# Patient Record
Sex: Female | Born: 1965 | Race: Black or African American | Hispanic: No | Marital: Married | Smoking: Never smoker
Health system: Southern US, Community
[De-identification: ages and names within clinical notes are randomized; demographics above are authoritative.]

## PROBLEM LIST (undated history)

## (undated) DIAGNOSIS — B019 Varicella without complication: Secondary | ICD-10-CM

## (undated) DIAGNOSIS — N6019 Diffuse cystic mastopathy of unspecified breast: Secondary | ICD-10-CM

## (undated) DIAGNOSIS — I10 Essential (primary) hypertension: Secondary | ICD-10-CM

## (undated) DIAGNOSIS — E785 Hyperlipidemia, unspecified: Secondary | ICD-10-CM

## (undated) DIAGNOSIS — G43909 Migraine, unspecified, not intractable, without status migrainosus: Secondary | ICD-10-CM

## (undated) HISTORY — PX: ECTOPIC PREGNANCY SURGERY: SHX613

## (undated) HISTORY — DX: Diffuse cystic mastopathy of unspecified breast: N60.19

## (undated) HISTORY — DX: Hyperlipidemia, unspecified: E78.5

## (undated) HISTORY — PX: ABDOMINAL HYSTERECTOMY: SHX81

## (undated) HISTORY — DX: Migraine, unspecified, not intractable, without status migrainosus: G43.909

## (undated) HISTORY — DX: Varicella without complication: B01.9

## (undated) HISTORY — DX: Essential (primary) hypertension: I10

---

## 1997-09-15 DIAGNOSIS — I10 Essential (primary) hypertension: Secondary | ICD-10-CM

## 1997-09-15 HISTORY — DX: Essential (primary) hypertension: I10

## 2005-05-06 ENCOUNTER — Ambulatory Visit: Payer: Self-pay

## 2006-05-12 ENCOUNTER — Ambulatory Visit: Payer: Self-pay

## 2006-07-14 ENCOUNTER — Ambulatory Visit: Payer: Self-pay | Admitting: Unknown Physician Specialty

## 2007-07-08 ENCOUNTER — Ambulatory Visit: Payer: Self-pay

## 2008-07-21 ENCOUNTER — Ambulatory Visit: Payer: Self-pay | Admitting: Family Medicine

## 2008-08-22 ENCOUNTER — Ambulatory Visit: Payer: Self-pay | Admitting: Family Medicine

## 2010-07-16 IMAGING — US US PELV - US TRANSVAGINAL
1 series · 17 of 25 positions shown · non-contrast
Comparison: none

REASON FOR EXAM: dysfunctional uterine bleeding
COMMENTS:

[Series 1: us pelv - us transvaginal · 17 of 54 slices shown]
[im 1/54]
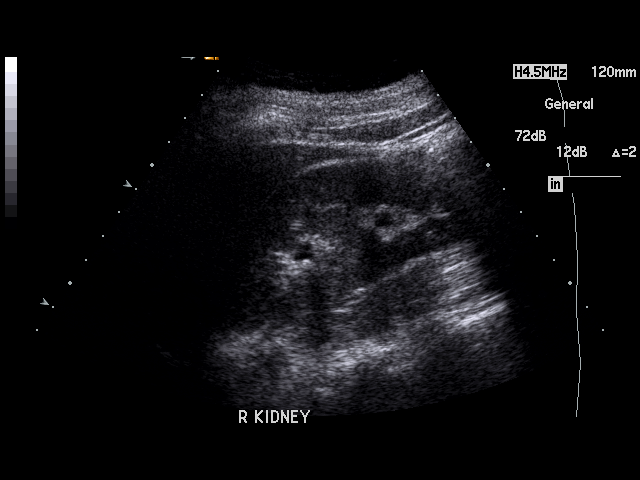
[im 5/54]
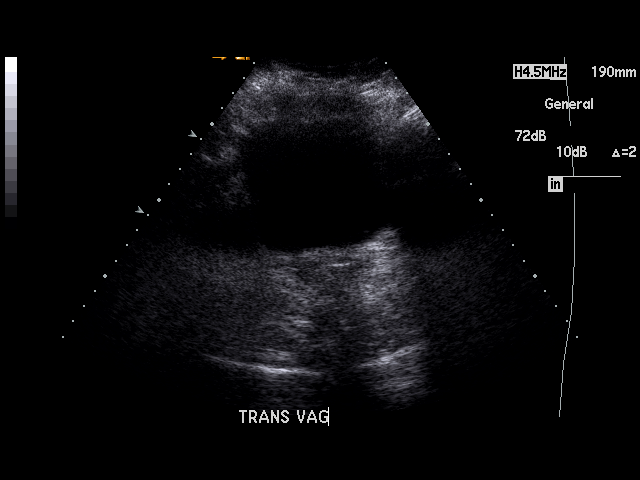
[im 7/54]
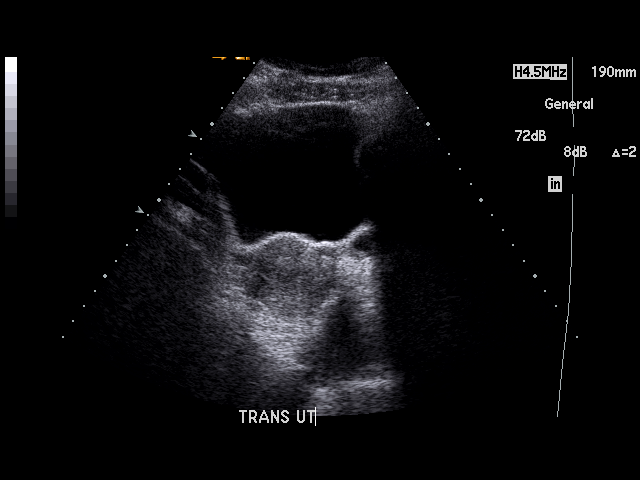
[im 12/54]
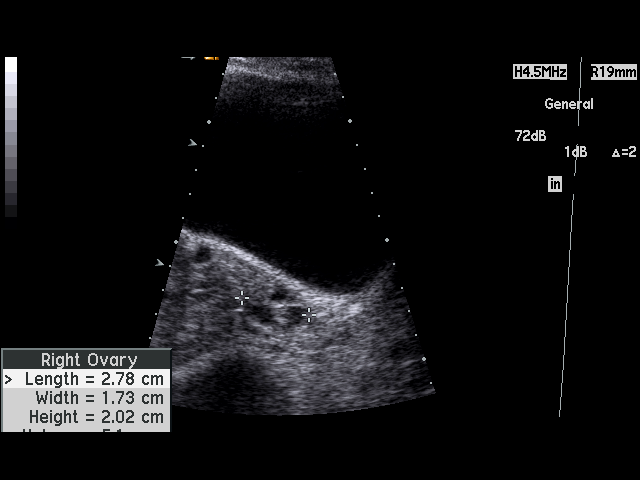
[im 14/54]
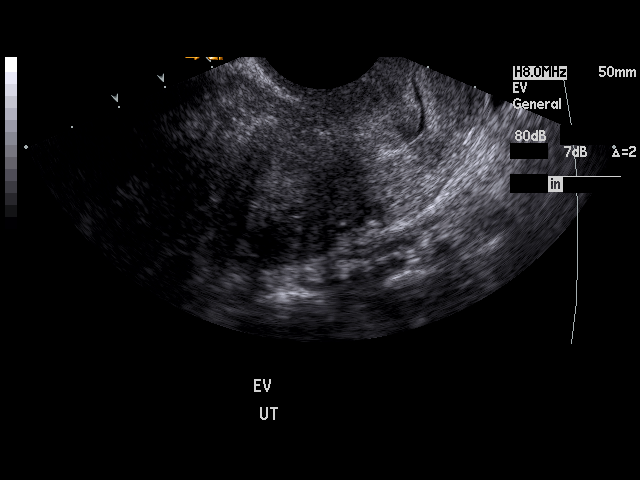
[im 18/54]
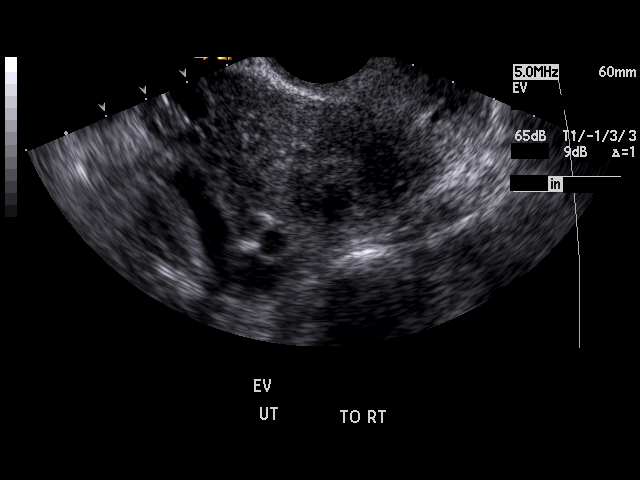
[im 20/54]
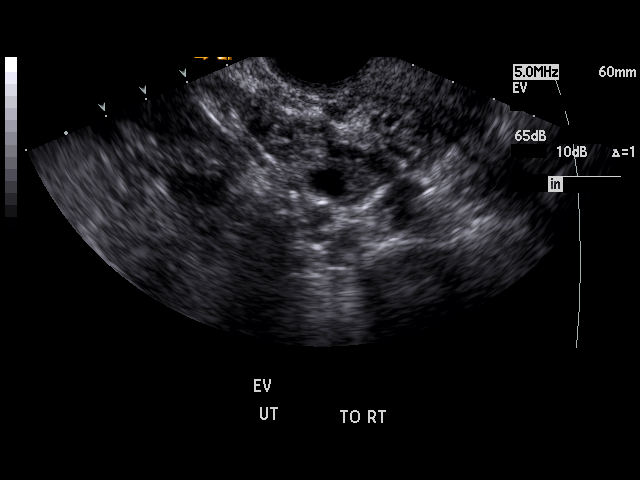
[im 25/54]
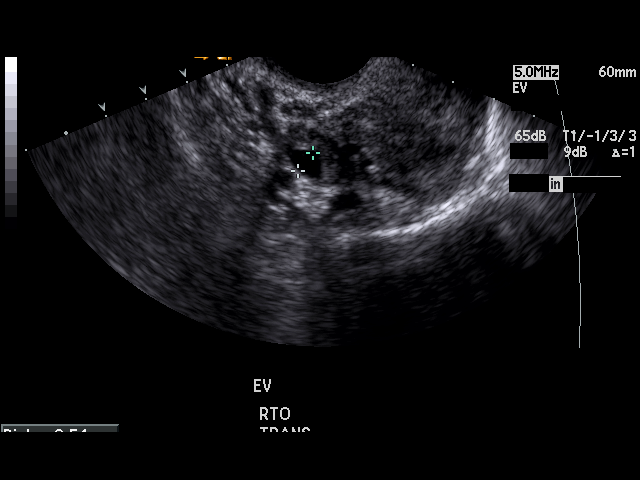
[im 27/54]
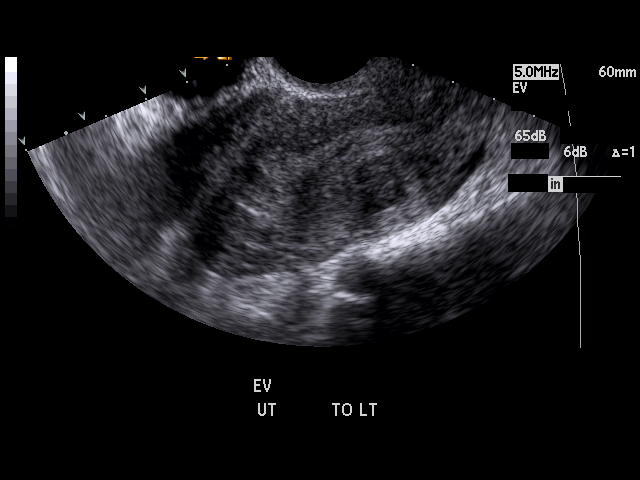
[im 29/54]
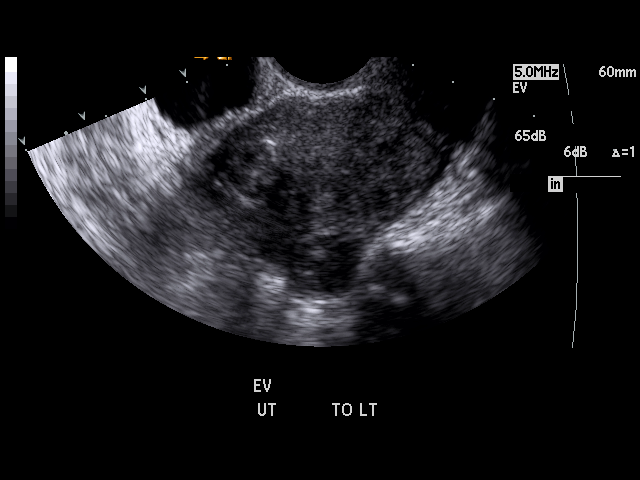
[im 34/54]
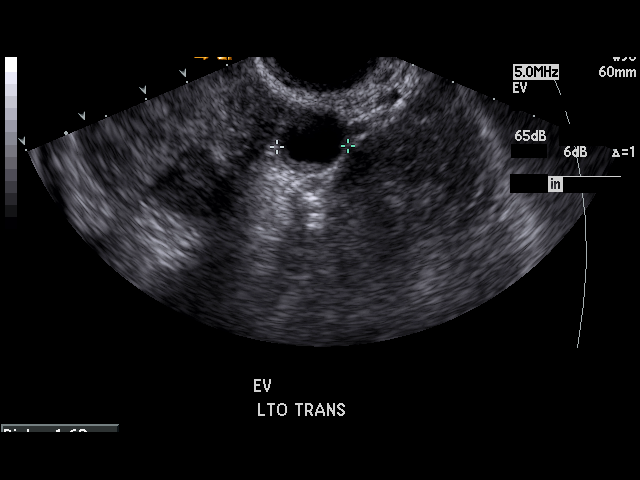
[im 36/54]
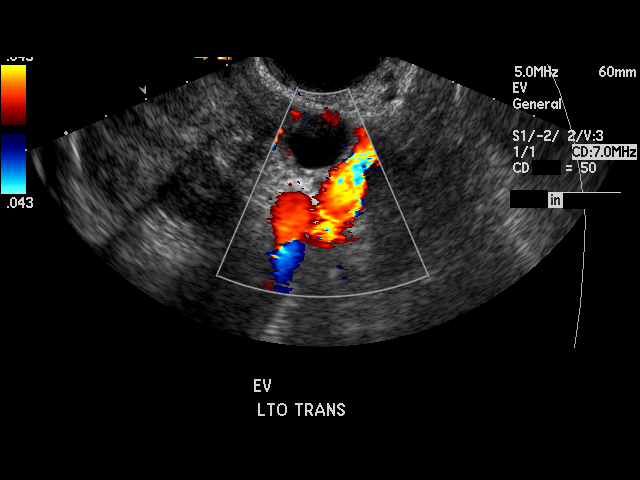
[im 40/54]
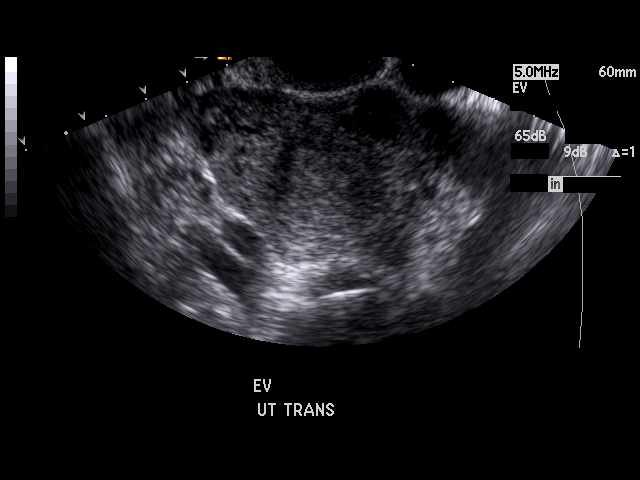
[im 42/54]
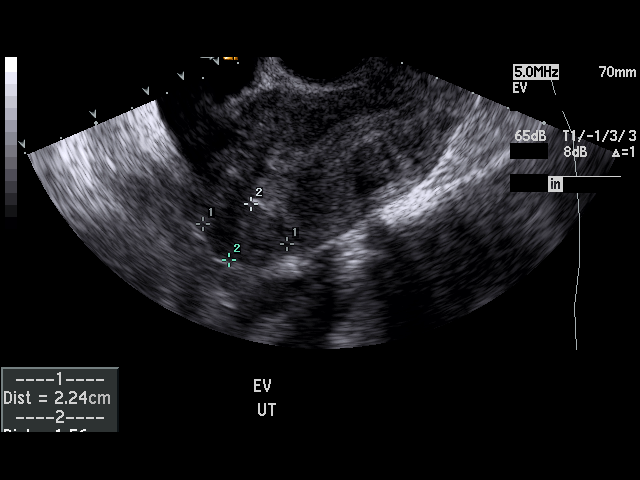
[im 47/54]
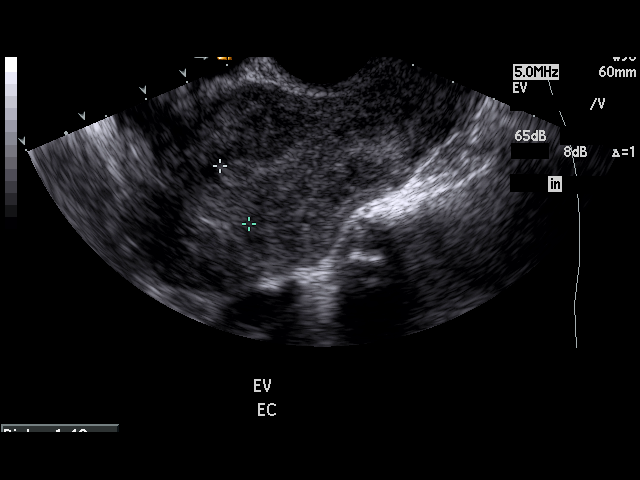
[im 49/54]
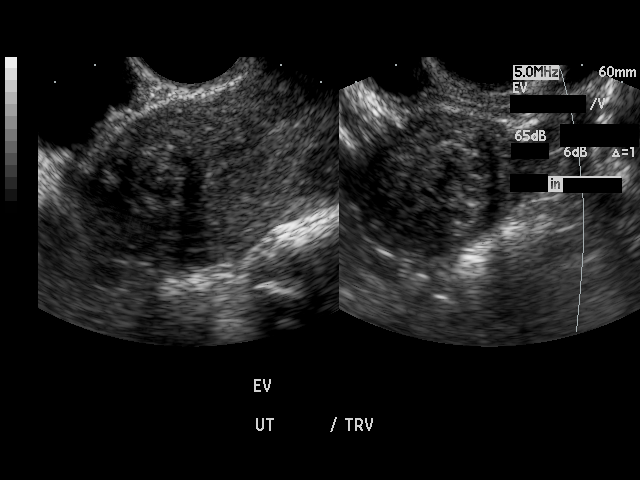
[im 54/54]
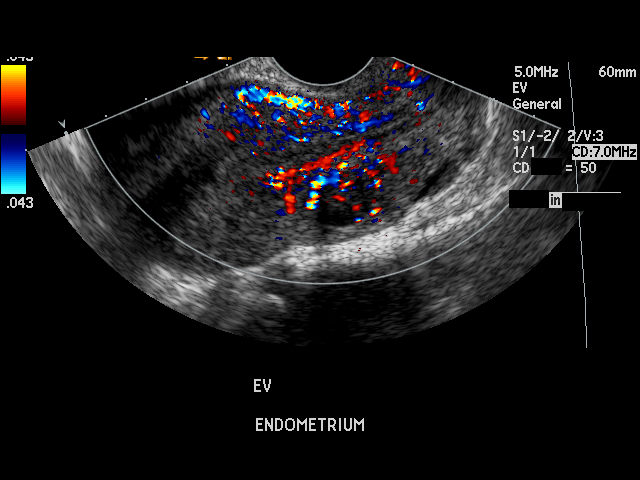

[17 of 25 positions shown; findings below may reference images not displayed]

PROCEDURE:     US  - US PELVIS MASS EXAM W/TRANSVAGI  - July 21, 2008  [DATE]

RESULT:     The uterus is mildly prominent in size measuring 10.2 x 4.8 x
4.9 cm. The endometrial stripe measures 7 mm. There are multiple fibroids
demonstrated in the uterus. The largest of these lies posteriorly and
measures 2.2 x 1.6 x 1.5 cm. The RIGHT ovary measures 2.4 x 1.2 x 1.0 cm and
contains an approximately 0.5 x 0.8 x 0.5 cm diameter cyst. The LEFT ovary
measures 3.8 x 1.7 x 1.6 cm and contains an approximately 1.8 x 1.3 x 1.4 cm
simple appearing cyst. Survey views of the kidneys are within the limits of
normal. There is a small amount of fluid in the cul-de-sac.
IMPRESSION: 1. There are multiple uterine fibroids present with the largest measuring
approximately 2.2 cm. The endometrial stripe is not abnormally thickened.
2. There are cysts associated with both ovaries which appear to be simple
cystic in nature.

## 2010-08-27 ENCOUNTER — Ambulatory Visit: Payer: Self-pay | Admitting: Family Medicine

## 2010-09-15 HISTORY — PX: PARTIAL HYSTERECTOMY: SHX80

## 2011-01-01 ENCOUNTER — Ambulatory Visit: Payer: Self-pay

## 2011-01-07 ENCOUNTER — Ambulatory Visit: Payer: Self-pay

## 2011-01-08 LAB — PATHOLOGY REPORT

## 2011-01-18 ENCOUNTER — Emergency Department: Payer: Self-pay | Admitting: Emergency Medicine

## 2011-09-16 DIAGNOSIS — N6019 Diffuse cystic mastopathy of unspecified breast: Secondary | ICD-10-CM

## 2011-09-16 HISTORY — DX: Diffuse cystic mastopathy of unspecified breast: N60.19

## 2011-09-16 HISTORY — PX: BREAST BIOPSY: SHX20

## 2012-01-28 ENCOUNTER — Ambulatory Visit: Payer: Self-pay | Admitting: Family Medicine

## 2012-04-26 ENCOUNTER — Ambulatory Visit: Payer: Self-pay

## 2012-11-24 ENCOUNTER — Encounter: Payer: Self-pay | Admitting: *Deleted

## 2012-12-15 ENCOUNTER — Encounter: Payer: Self-pay | Admitting: General Surgery

## 2013-01-14 ENCOUNTER — Encounter: Payer: Self-pay | Admitting: Internal Medicine

## 2013-01-14 ENCOUNTER — Ambulatory Visit (INDEPENDENT_AMBULATORY_CARE_PROVIDER_SITE_OTHER): Payer: BC Managed Care – PPO | Admitting: Internal Medicine

## 2013-01-14 VITALS — BP 130/82 | HR 96 | Temp 98.2°F | Resp 16 | Ht 62.0 in | Wt 168.2 lb

## 2013-01-14 DIAGNOSIS — R7989 Other specified abnormal findings of blood chemistry: Secondary | ICD-10-CM

## 2013-01-14 DIAGNOSIS — R635 Abnormal weight gain: Secondary | ICD-10-CM

## 2013-01-14 DIAGNOSIS — I10 Essential (primary) hypertension: Secondary | ICD-10-CM

## 2013-01-14 DIAGNOSIS — E785 Hyperlipidemia, unspecified: Secondary | ICD-10-CM | POA: Insufficient documentation

## 2013-01-14 DIAGNOSIS — R946 Abnormal results of thyroid function studies: Secondary | ICD-10-CM

## 2013-01-14 NOTE — Progress Notes (Signed)
Patient ID: Bethany Fernandez, female   DOB: 09-29-1965, 47 y.o.   MRN: [id number]   Patient Active Problem List   Diagnosis Date Noted  . Weight gain, abnormal 01/16/2013  . Abnormal thyroid blood test 01/16/2013  . Essential hypertension, benign 01/14/2013    Subjective:  CC:   Chief Complaint  Patient presents with  . Establish Care    HPI:   Bethany Fernandez is a 47 y.o. female who presents as a new patient to establish primary care with the chief complaint of  Weight gain.  She is a 47 yr old AA female with a history of treated hypertension who was referred by patient Jaquita Rector for primary care. Former PCP Artis Flock,  Who moved to [place]. . .  Weighed 104 lbs when she joined the [employer] after high school and weighed 134 lbs upon returning to civilian life and has gained 34 lbs since then.  She was exercisinsg regularly until Dec 2013, and lost her momemtum at Christmas.  She has home equipment including an elliptical  Machine but does not use it currently.   History of abnormal thyroid test last year, never rechecked. Not sure if she was told underactive or overactive.   She has a history of a right breast biopsy on right,  Cyst.  2013, biopsied by [facility].    Has one scheduled this month.  No kids , 2 step sons 19 and 41 yrs old. No behavioral issues.    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  No headaches,  Some insomnia.  Hot flashes,  Started at 37.  FH of early menopause in [place] .  Not miserable    Past Medical History  Diagnosis Date  . Fibrocystic breast 2013  . Hypertension 1999  . Chicken pox   . Hyperlipidemia   . Migraines     Past Surgical History  Procedure Laterality Date  . Partial hysterectomy  2012  . Breast biopsy  2013    right  . Abdominal hysterectomy    . Ectopic pregnancy surgery      Family History  Problem Relation Age of Onset  . Breast cancer Cousin 50  . Lung disease Maternal Grandfather 36  . Alcohol abuse Maternal Grandfather   . Hyperlipidemia Mother   .  Hypertension Mother   . Hyperlipidemia Father   . Hypertension Father   . Diabetes Father   . Stroke Paternal Grandfather     History   Social History  . Marital Status: Married    Spouse Name: N/A    Number of Children: N/A  . Years of Education: N/A   Occupational History  . Not on file.   Social History Main Topics  . Smoking status: Never Smoker   . Smokeless tobacco: Never Used  . Alcohol Use: Yes  . Drug Use: No  . Sexually Active: Not on file   Other Topics Concern  . Not on file   Social History Narrative  . No narrative on file       @ALLHX @    Review of Systems:   Patient denies headache, fevers, malaise, unintentional weight loss, skin rash, eye pain, sinus congestion and sinus pain, sore throat, dysphagia,  hemoptysis , cough, dyspnea, wheezing, chest pain, palpitations, orthopnea, edema, abdominal pain, nausea, melena, diarrhea, constipation, flank pain, dysuria, hematuria, urinary  Frequency, nocturia, numbness, tingling, seizures,  Focal weakness, Loss of consciousness,  Tremor, insomnia, depression, anxiety, and suicidal ideation.     Objective:  BP 130/82  Pulse 96  Temp(Src) 98.2 F (36.8 C) (Oral)  Resp 16  Ht 5\' 2"  (1.575 m)  Wt 168 lb 4 oz (76.318 kg)  BMI 30.77 kg/m2  SpO2 98%  General appearance: alert, cooperative and appears stated age Ears: normal TM's and external ear canals both ears Throat: lips, mucosa, and tongue normal; teeth and gums normal Neck: no adenopathy, no carotid bruit, supple, symmetrical, trachea midline and thyroid not enlarged, symmetric, no tenderness/mass/nodules Back: symmetric, no curvature. ROM normal. No CVA tenderness. Lungs: clear to auscultation bilaterally Heart: regular rate and rhythm, S1, S2 normal, no murmur, click, rub or gallop Abdomen: soft, non-tender; bowel sounds normal; no masses,  no organomegaly Pulses: 2+ and symmetric Skin: Skin color, texture, turgor normal. No rashes or  lesions Lymph nodes: Cervical, supraclavicular, and axillary nodes normal.  Assessment and Plan:  Weight gain, abnormal I have addressed  BMI and recommended a low glycemic index diet utilizing smaller more frequent meals to increase metabolism.  I have also recommended that patient start exercising with a goal of 30 minutes of aerobic exercise a minimum of 5 days per week. Screening for lipid disorders, thyroid and diabetes to be done at her leisure.    Essential hypertension, benign Managed with amlodipine.  She reports a history of prior edema with lisinopril, but no workup for RAS was done. Will review records and evaluate renal function and urine for protein     Abnormal thyroid blood test Per patient, with no follow up. Will screen with TSH   Updated Medication List Outpatient Encounter Prescriptions as of 01/14/2013  Medication Sig Dispense Refill  . amLODipine (NORVASC) 5 MG tablet Take 5 mg by mouth daily.      . cyanocobalamin 1000 MCG tablet Take 100 mcg by mouth daily.      . Flaxseed, Linseed, (FLAX SEED OIL) 1300 MG CAPS Take 1 capsule by mouth daily.       No facility-administered encounter medications on file as of 01/14/2013.     Orders Placed This Encounter  Procedures  . Microalbumin / creatinine urine ratio  . TSH  . Lipid panel  . Comprehensive metabolic panel    No Follow-up on file.

## 2013-01-16 ENCOUNTER — Encounter: Payer: Self-pay | Admitting: Internal Medicine

## 2013-01-16 DIAGNOSIS — R7989 Other specified abnormal findings of blood chemistry: Secondary | ICD-10-CM | POA: Insufficient documentation

## 2013-01-16 DIAGNOSIS — R635 Abnormal weight gain: Secondary | ICD-10-CM | POA: Insufficient documentation

## 2013-01-16 NOTE — Assessment & Plan Note (Addendum)
I have addressed  BMI and recommended a low glycemic index diet utilizing smaller more frequent meals to increase metabolism.  I have also recommended that patient start exercising with a goal of 30 minutes of aerobic exercise a minimum of 5 days per week. Screening for lipid disorders, thyroid and diabetes to be done at her leisure.

## 2013-01-16 NOTE — Assessment & Plan Note (Signed)
Managed with amlodipine.  She reports a history of prior edema with lisinopril, but no workup for RAS was done. Will review records and evaluate renal function and urine for protein

## 2013-01-16 NOTE — Assessment & Plan Note (Signed)
Per patient, with no follow up. Will screen with TSH

## 2013-02-11 ENCOUNTER — Other Ambulatory Visit: Payer: Self-pay | Admitting: *Deleted

## 2013-02-11 MED ORDER — AMLODIPINE BESYLATE 5 MG PO TABS: 5.0000 mg | ORAL_TABLET | Freq: Every day | ORAL | Status: DC

## 2013-02-14 ENCOUNTER — Ambulatory Visit: Payer: Self-pay | Admitting: General Surgery

## 2013-02-15 ENCOUNTER — Encounter: Payer: Self-pay | Admitting: General Surgery

## 2013-02-17 ENCOUNTER — Ambulatory Visit: Payer: Self-pay | Admitting: General Surgery

## 2013-02-23 ENCOUNTER — Encounter: Payer: Self-pay | Admitting: General Surgery

## 2013-02-23 ENCOUNTER — Ambulatory Visit (INDEPENDENT_AMBULATORY_CARE_PROVIDER_SITE_OTHER): Payer: BC Managed Care – PPO | Admitting: General Surgery

## 2013-02-23 VITALS — BP 136/84 | HR 62 | Resp 12 | Ht 63.0 in | Wt 165.0 lb

## 2013-02-23 DIAGNOSIS — Z87898 Personal history of other specified conditions: Secondary | ICD-10-CM | POA: Insufficient documentation

## 2013-02-23 NOTE — Progress Notes (Signed)
Patient ID: Bethany Fernandez, female   DOB: 1966/01/28, 47 y.o.   MRN: [id number]  Chief Complaint  Patient presents with  . Other    mammogram    HPI Bethany Fernandez is a 47 y.o. female Who presents for a breast evaluation.The patient has had a history of FCD. The most recent mammogram was done on 02/14/13.Patient does perform regular self breast checks and gets regular mammograms done.  No new breast problems.HPI  Past Medical History  Diagnosis Date  . Fibrocystic breast 2013  . Hypertension 1999  . Chicken pox   . Hyperlipidemia   . Migraines     Past Surgical History  Procedure Laterality Date  . Partial hysterectomy  2012  . Breast biopsy  2013    right  . Abdominal hysterectomy    . Ectopic pregnancy surgery      Family History  Problem Relation Age of Onset  . Breast cancer Cousin 50  . Lung disease Maternal Grandfather 21  . Alcohol abuse Maternal Grandfather   . Hyperlipidemia Mother   . Hypertension Mother   . Hyperlipidemia Father   . Hypertension Father   . Diabetes Father   . Stroke Paternal Grandfather     Social History History  Substance Use Topics  . Smoking status: Never Smoker   . Smokeless tobacco: Never Used  . Alcohol Use: Yes    Allergies  Allergen Reactions  . Tape Rash    Current Outpatient Prescriptions  Medication Sig Dispense Refill  . amLODipine (NORVASC) 5 MG tablet Take 1 tablet (5 mg total) by mouth daily.  30 tablet  5  . Flaxseed, Linseed, (FLAX SEED OIL) 1300 MG CAPS Take 1 capsule by mouth daily.      . cyanocobalamin 1000 MCG tablet Take 100 mcg by mouth daily.       No current facility-administered medications for this visit.    Review of Systems Review of Systems  Constitutional: Negative.   Respiratory: Negative.   Cardiovascular: Negative.     Blood pressure 136/84, pulse 62, resp. rate 12, height 5\' 3"  (1.6 m), weight 165 lb (74.844 kg).  Physical Exam Physical Exam  Constitutional: She appears well-developed.   Eyes: Conjunctivae are normal. No scleral icterus.  Neck: Neck supple.  Cardiovascular: Normal rate, regular rhythm and normal heart sounds.   Pulmonary/Chest: Breath sounds normal. Right breast exhibits inverted nipple. Right breast exhibits no mass, no nipple discharge, no skin change and no tenderness. Left breast exhibits inverted nipple. Left breast exhibits no mass, no nipple discharge, no skin change and no tenderness.  Chronic inverted/sunken nipples.  Abdominal: Bowel sounds are normal.  Lymphadenopathy:    She has no cervical adenopathy.    She has no axillary adenopathy.    Data Reviewed Mammogram reviewed-cat 2  Assessment    Exam stable     Plan    Patient to return in one year with bilateral screening mammogram       [name] G 02/23/2013, 7:06 PM

## 2013-02-23 NOTE — Patient Instructions (Addendum)
Patient to return in one year bilateral screening mammogram. 

## 2013-04-12 ENCOUNTER — Other Ambulatory Visit (INDEPENDENT_AMBULATORY_CARE_PROVIDER_SITE_OTHER): Payer: BC Managed Care – PPO

## 2013-04-12 DIAGNOSIS — I10 Essential (primary) hypertension: Secondary | ICD-10-CM

## 2013-04-12 DIAGNOSIS — R635 Abnormal weight gain: Secondary | ICD-10-CM

## 2013-04-12 DIAGNOSIS — R946 Abnormal results of thyroid function studies: Secondary | ICD-10-CM

## 2013-04-12 DIAGNOSIS — R7989 Other specified abnormal findings of blood chemistry: Secondary | ICD-10-CM

## 2013-04-12 LAB — MICROALBUMIN / CREATININE URINE RATIO: Microalb, Ur: 2.5 mg/dL — ABNORMAL HIGH (ref 0.0–1.9)

## 2013-04-12 LAB — LIPID PANEL
Cholesterol: 242 mg/dL — ABNORMAL HIGH (ref 0–200)
HDL: 63.5 mg/dL (ref >39.00)
Total CHOL/HDL Ratio: 4
VLDL: 8.8 mg/dL (ref 0.0–40.0)

## 2013-04-12 LAB — COMPREHENSIVE METABOLIC PANEL
ALT: 21 U/L (ref 0–35)
AST: 20 U/L (ref 0–37)
Alkaline Phosphatase: 57 U/L (ref 39–117)
Calcium: 9.4 mg/dL (ref 8.4–10.5)
Chloride: 105 mEq/L (ref 96–112)
Creatinine, Ser: 1 mg/dL (ref 0.4–1.2)
Total Bilirubin: 0.4 mg/dL (ref 0.3–1.2)

## 2013-04-19 ENCOUNTER — Ambulatory Visit (INDEPENDENT_AMBULATORY_CARE_PROVIDER_SITE_OTHER): Payer: BC Managed Care – PPO | Admitting: Internal Medicine

## 2013-04-19 ENCOUNTER — Encounter: Payer: Self-pay | Admitting: Internal Medicine

## 2013-04-19 ENCOUNTER — Other Ambulatory Visit (HOSPITAL_COMMUNITY)
Admission: RE | Admit: 2013-04-19 | Discharge: 2013-04-19 | Disposition: A | Discharge status: Home / Self Care | Payer: BC Managed Care – PPO | Source: Ambulatory Visit | Attending: Internal Medicine | Admitting: Internal Medicine

## 2013-04-19 VITALS — BP 126/92 | HR 87 | Temp 98.7°F | Resp 14 | Ht 63.0 in | Wt 166.2 lb

## 2013-04-19 DIAGNOSIS — K648 Other hemorrhoids: Secondary | ICD-10-CM | POA: Insufficient documentation

## 2013-04-19 DIAGNOSIS — Z0000 Encounter for general adult medical examination without abnormal findings: Secondary | ICD-10-CM

## 2013-04-19 DIAGNOSIS — E7849 Other hyperlipidemia: Secondary | ICD-10-CM | POA: Insufficient documentation

## 2013-04-19 DIAGNOSIS — Z1151 Encounter for screening for human papillomavirus (HPV): Secondary | ICD-10-CM | POA: Insufficient documentation

## 2013-04-19 DIAGNOSIS — N6320 Unspecified lump in the left breast, unspecified quadrant: Secondary | ICD-10-CM | POA: Insufficient documentation

## 2013-04-19 DIAGNOSIS — I10 Essential (primary) hypertension: Secondary | ICD-10-CM

## 2013-04-19 DIAGNOSIS — R635 Abnormal weight gain: Secondary | ICD-10-CM

## 2013-04-19 DIAGNOSIS — Z01419 Encounter for gynecological examination (general) (routine) without abnormal findings: Secondary | ICD-10-CM | POA: Insufficient documentation

## 2013-04-19 DIAGNOSIS — N63 Unspecified lump in unspecified breast: Secondary | ICD-10-CM

## 2013-04-19 DIAGNOSIS — Z124 Encounter for screening for malignant neoplasm of cervix: Secondary | ICD-10-CM

## 2013-04-19 DIAGNOSIS — Z23 Encounter for immunization: Secondary | ICD-10-CM

## 2013-04-19 DIAGNOSIS — E785 Hyperlipidemia, unspecified: Secondary | ICD-10-CM

## 2013-04-19 MED ORDER — HYDROCORTISONE ACETATE 25 MG RE SUPP: 25.0000 mg | Freq: Two times a day (BID) | RECTAL | Status: DC

## 2013-04-19 NOTE — Progress Notes (Signed)
Patient ID: Chelsea Pedretti, female   DOB: [birth year], 47 y.o.   MRN: [id number]    Subjective:     Ozie Dimaria is a 47 y.o. female and is here for a comprehensive physical exam. The patient reports left sided breast pain.  History   Social History  . Marital Status: Married    Spouse Name: N/A    Number of Children: N/A  . Years of Education: N/A   Occupational History  . Not on file.   Social History Main Topics  . Smoking status: Never Smoker   . Smokeless tobacco: Never Used  . Alcohol Use: Yes  . Drug Use: No  . Sexually Active: Not on file   Other Topics Concern  . Not on file   Social History Narrative  . No narrative on file   Health Maintenance  Topic Date Due  . Pap Smear  12/21/1985  . Tetanus/tdap  12/22/1986  . Influenza Vaccine  05/16/2013    The following portions of the patient's history were reviewed and updated as appropriate: allergies, current medications, past family history, past medical history, past social history, past surgical history and problem list.  Review of Systems A comprehensive review of systems was negative.   Objective:   BP 126/92  Pulse 87  Temp(Src) 98.7 F (37.1 C) (Oral)  Resp 14  Ht 5\' 3"  (1.6 m)  Wt 166 lb 4 oz (75.411 kg)  BMI 29.46 kg/m2  SpO2 98%  General Appearance:    Alert, cooperative, no distress, appears stated age  Head:    Normocephalic, without obvious abnormality, atraumatic  Eyes:    PERRL, conjunctiva/corneas clear, EOM's intact, fundi    benign, both eyes  Ears:    Normal TM's and external ear canals, both ears  Nose:   Nares normal, septum midline, mucosa normal, no drainage    or sinus tenderness  Throat:   Lips, mucosa, and tongue normal; teeth and gums normal  Neck:   Supple, symmetrical, trachea midline, no adenopathy;    thyroid:  no enlargement/tenderness/nodules; no carotid   bruit or JVD  Back:     Symmetric, no curvature, ROM normal, no CVA tenderness  Lungs:     Clear to auscultation  bilaterally, respirations unlabored  Chest Wall:    No tenderness or deformity   Heart:    Regular rate and rhythm, S1 and S2 normal, no murmur, rub   or gallop  Breast Exam:    Left breast tenderness, with tender nodule at 7:35 position, inverted nipples bilaterally  Abdomen:     Soft, non-tender, bowel sounds active all four quadrants,    no masses, no organomegaly  Genitalia:    Pelvic: cervix normal in appearance, external genitalia normal, no adnexal masses or tenderness, no cervical motion tenderness, rectovaginal septum normal, uterus absent, and vagina normal without discharge  Extremities:   Extremities normal, atraumatic, no cyanosis or edema  Pulses:   2+ and symmetric all extremities  Skin:   Skin color, texture, turgor normal, no rashes or lesions  Lymph nodes:   Cervical, supraclavicular, and axillary nodes normal  Neurologic:   CNII-XII intact, normal strength, sensation and reflexes    throughout   Assessment and Plan:  Routine general medical examination at a health care facility Annual comprehensive exam was done including breast, pelvic and PAP smear. All screenings have been addressed .   Breast mass, left Given recent exam by [facility] and normal mammogram,  suspect mass is reactive.  If  still present in 4 weeks,  Ultrasound  Weight gain, abnormal I have addressed  BMI and recommended a low glycemic index diet utilizing smaller more frequent meals to increase metabolism.  I have also recommended that patient start exercising with a goal of 30 minutes of aerobic exercise a minimum of 5 days per week. Screening for lipid disorders, thyroid and diabetes to be done today.    Essential hypertension, benign Well controlled on current regimen. Renal function stable, no changes today.  Other and unspecified hyperlipidemia New ACC guidelines recommend starting patients aged 57 or higher on moderate intensity statin therapy for LDL between 70-189 and 10 yr risk of CAD > 7.5% ;   and high intensity therapy for anyone with LDL > 190.   6 month trial of RYR    Updated Medication List Outpatient Encounter Prescriptions as of 04/19/2013  Medication Sig Dispense Refill  . amLODipine (NORVASC) 5 MG tablet Take 1 tablet (5 mg total) by mouth daily.  30 tablet  5  . calcium-vitamin D (OSCAL WITH D) 250-125 MG-UNIT per tablet Take 1 tablet by mouth daily.      . cyanocobalamin 1000 MCG tablet Take 100 mcg by mouth daily.      . Flaxseed, Linseed, (FLAX SEED OIL) 1300 MG CAPS Take 1 capsule by mouth daily.      . hydrocortisone (ANUSOL-HC) 25 MG suppository Place 1 suppository (25 mg total) rectally 2 (two) times daily.  12 suppository  0   No facility-administered encounter medications on file as of 04/19/2013.

## 2013-04-19 NOTE — Patient Instructions (Addendum)
New ACC guidelines recommend starting patients aged 45 or higher on low  intensity statin therapy for hypertension  and concurrent LDL between 70-189.   I am recommending that we try red yeast rice (mg twice daily in capsule form ) and regular exercise first for 6 months followed by repeat fasting lipids in 6 months  You received the TDaP vaccine today  If your breast is still tender in 4  weeks,  Let me know and we will schedule an ultrasound   I will send a prescripition for a hemorrhoid suppository to your pharmacy to use as needed for anal irritation.

## 2013-04-19 NOTE — Assessment & Plan Note (Signed)
Annual comprehensive exam was done including breast, pelvic and PAP smear. All screenings have been addressed .  

## 2013-04-19 NOTE — Assessment & Plan Note (Signed)
I have addressed  BMI and recommended a low glycemic index diet utilizing smaller more frequent meals to increase metabolism.  I have also recommended that patient start exercising with a goal of 30 minutes of aerobic exercise a minimum of 5 days per week. Screening for lipid disorders, thyroid and diabetes to be done today.   

## 2013-04-19 NOTE — Assessment & Plan Note (Signed)
Given recent exam by [facility] and normal mammogram,  suspect mass is reactive.  If still present in 4 weeks,  Ultrasound

## 2013-04-19 NOTE — Assessment & Plan Note (Signed)
Well controlled on current regimen. Renal function stable, no changes today. 

## 2013-04-19 NOTE — Assessment & Plan Note (Signed)
New ACC guidelines recommend starting patients aged 45 or higher on moderate intensity statin therapy for LDL between 70-189 and 10 yr risk of CAD > 7.5% ;  and high intensity therapy for anyone with LDL > 190.   6 month trial of RYR

## 2013-04-22 ENCOUNTER — Encounter: Payer: Self-pay | Admitting: *Deleted

## 2013-04-30 LAB — HM PAP SMEAR: HM Pap smear: NORMAL

## 2013-05-02 ENCOUNTER — Encounter: Payer: Self-pay | Admitting: Internal Medicine

## 2013-08-25 ENCOUNTER — Other Ambulatory Visit: Payer: Self-pay | Admitting: *Deleted

## 2013-08-25 MED ORDER — AMLODIPINE BESYLATE 5 MG PO TABS: 5.0000 mg | ORAL_TABLET | Freq: Every day | ORAL | Status: DC

## 2013-11-10 ENCOUNTER — Ambulatory Visit: Payer: BC Managed Care – PPO | Admitting: Internal Medicine

## 2014-02-20 ENCOUNTER — Other Ambulatory Visit: Payer: BC Managed Care – PPO

## 2014-02-20 ENCOUNTER — Ambulatory Visit (INDEPENDENT_AMBULATORY_CARE_PROVIDER_SITE_OTHER): Payer: BC Managed Care – PPO | Admitting: Internal Medicine

## 2014-02-20 DIAGNOSIS — E785 Hyperlipidemia, unspecified: Secondary | ICD-10-CM

## 2014-02-20 LAB — COMPREHENSIVE METABOLIC PANEL
ALT: 22 U/L (ref 0–35)
AST: 23 U/L (ref 0–37)
Albumin: 3.9 g/dL (ref 3.5–5.2)
Alkaline Phosphatase: 58 U/L (ref 39–117)
BILIRUBIN TOTAL: 0.6 mg/dL (ref 0.2–1.2)
BUN: 12 mg/dL (ref 6–23)
CO2: 29 mEq/L (ref 19–32)
CREATININE: 0.9 mg/dL (ref 0.4–1.2)
Calcium: 9.6 mg/dL (ref 8.4–10.5)
Chloride: 104 mEq/L (ref 96–112)
GFR: 82.39 mL/min (ref >60.00)
Glucose, Bld: 95 mg/dL (ref 70–99)
Potassium: 4.5 mEq/L (ref 3.5–5.1)
Sodium: 139 mEq/L (ref 135–145)
Total Protein: 6.9 g/dL (ref 6.0–8.3)

## 2014-02-20 LAB — LIPID PANEL
CHOL/HDL RATIO: 4
Cholesterol: 248 mg/dL — ABNORMAL HIGH (ref 0–200)
HDL: 65.4 mg/dL (ref >39.00)
LDL CALC: 173 mg/dL — AB (ref 0–99)
NONHDL: 182.6
TRIGLYCERIDES: 47 mg/dL (ref 0.0–149.0)
VLDL: 9.4 mg/dL (ref 0.0–40.0)

## 2014-02-20 NOTE — Progress Notes (Signed)
Patient ID: Bethany Fernandez, female   DOB: [birth year], 48 y.o.   MRN: [id number] Patient arrive at 8:15 for 8:00 appt and was asked to reschedule.  Labs were drawn since she was due for fasting labs and had fasted

## 2014-02-21 ENCOUNTER — Encounter: Payer: Self-pay | Admitting: *Deleted

## 2014-03-01 LAB — HM MAMMOGRAPHY

## 2014-03-02 ENCOUNTER — Encounter: Payer: Self-pay | Admitting: General Surgery

## 2014-03-03 ENCOUNTER — Ambulatory Visit (INDEPENDENT_AMBULATORY_CARE_PROVIDER_SITE_OTHER): Payer: BC Managed Care – PPO | Admitting: Internal Medicine

## 2014-03-03 ENCOUNTER — Encounter: Payer: Self-pay | Admitting: Internal Medicine

## 2014-03-03 ENCOUNTER — Other Ambulatory Visit: Payer: Self-pay | Admitting: *Deleted

## 2014-03-03 VITALS — BP 134/90 | HR 77 | Temp 98.5°F | Resp 16 | Ht 63.0 in | Wt 164.8 lb

## 2014-03-03 DIAGNOSIS — I10 Essential (primary) hypertension: Secondary | ICD-10-CM

## 2014-03-03 DIAGNOSIS — IMO0002 Reserved for concepts with insufficient information to code with codable children: Secondary | ICD-10-CM

## 2014-03-03 DIAGNOSIS — E669 Obesity, unspecified: Secondary | ICD-10-CM

## 2014-03-03 DIAGNOSIS — E785 Hyperlipidemia, unspecified: Secondary | ICD-10-CM

## 2014-03-03 MED ORDER — AMLODIPINE BESYLATE 5 MG PO TABS: 5.0000 mg | ORAL_TABLET | Freq: Every day | ORAL | Status: DC

## 2014-03-03 MED ORDER — RED YEAST RICE EXTRACT 600 MG PO CAPS
1.0000 | ORAL_CAPSULE | Freq: Two times a day (BID) | ORAL | Status: DC
Start: 2014-03-03 — End: 2015-03-20

## 2014-03-03 MED ORDER — FUROSEMIDE 20 MG PO TABS: 20.0000 mg | ORAL_TABLET | Freq: Every day | ORAL | Status: DC

## 2014-03-03 NOTE — Patient Instructions (Addendum)
You are doing well.  We discussed starting red yeast rice for your cholesterol ,  And furosemide as needed for fluid retention   I will see you again in 6 months for your annual exam (NO PAP required)   This is my version of a  "Low GI"  Diet:     All of the foods can be found at grocery stores and in bulk at [employer].  The Atkins protein bars and shakes are available in more varieties at Target, WalMart and [employer].     7 AM Breakfast:  Choose from the following:  Low carbohydrate Protein  Shakes (I recommend the EAS AdvantEdge "Carb Control" shakes  Or the low carb shakes by Atkins.    2.5 carbs   Arnold's "Sandwhich Thin"toasted  w/ peanut butter (no jelly: about 20 net carbs  "Bagel Thin" with cream cheese and salmon: about 20 carbs   a scrambled egg/bacon/cheese burrito made with Mission's "carb balance" whole wheat tortilla  (about 10 net carbs )  Try a frittata slice,  (quiche without the crust) available at [facility]  For 7.99  5 carbs/slice    Avoid cereal and bananas, oatmeal and cream of wheat and grits. They are loaded with carbohydrates!   10 AM: high protein snack  Protein bar by Atkins (the snack size, under 200 cal, usually < 6 net carbs).    A stick of cheese:  Around 1 carb,  100 cal     Dannon Light n Fit AustriaGreek Yogurt  (80 cal, 8 carbs)  Other so called "protein bars" and Greek yogurts tend to be loaded with carbohydrates.  Remember, in food advertising, the word "energy" is synonymous for " carbohydrate."  Lunch:   A Sandwich using the bread choices listed, Can use any  Eggs,  lunchmeat, grilled meat or canned tuna), avocado, regular mayo/mustard  and cheese.  A Salad using blue cheese, ranch,  Goddess or vinagrette,  No croutons or "confetti" and no "candied nuts" but regular nuts OK.   No pretzels or chips.  Pickles and miniature sweet peppers are a good low carb alternative that provide a "crunch"  The bread is the only source of carbohydrate in a sandwich and   can be decreased by trying some of these alternatives to traditional loaf bread  Joseph's makes a pita bread and a flat bread that are 50 cal and 4 net carbs available at BJs and WalMart.  This can be toasted to use with hummous as well  Toufayan makes a low carb flatbread that's 100 cal and 9 net carbs available at [employer] and [employer]  Mission makes 2 sizes of  Low carb whole wheat tortilla  (The large one is 210 cal and 6 net carbs) Avoid "Low fat dressings, as well as Reyne DumasCatalina and [address] dressings They are loaded with sugar!   3 PM/ Mid day  Snack:  Consider  1 ounce of  almonds, walnuts, pistachios, pecans, peanuts,  Macadamia nuts or a nut medley.  Avoid "granola"; the dried cranberries and raisins are loaded with carbohydrates. Mixed nuts as long as there are no raisins,  cranberries or dried fruit.    Try the prosciutto/mozzarella cheese sticks by Fiorruci  In deli /backery section   High protein      6 PM  Dinner:     Meat/fowl/fish with a green salad, and either broccoli, cauliflower, green beans, spinach, brussel sprouts or  Lima beans. DO NOT BREAD THE PROTEIN!!  There is a low carb pasta by Dreamfield's that is acceptable and tastes great: only 5 digestible carbs/serving.( All grocery stores but BJs carry it )  Try Kai LevinsMichel Angelo's chicken piccata or chicken or eggplant parm over low carb pasta.(Lowes and BJs)   Clifton CustardAaron Sanchez's "Carnitas" (pulled pork, no sauce,  0 carbs) or his beef pot roast to make a dinner burrito (at BJ's)  Pesto over low carb pasta (bj's sells a good quality pesto in the center refrigerated section of the deli   Try satueeing  Roosvelt HarpsBok Choy with mushroooms  Whole wheat pasta is still full of digestible carbs and  Not as low in glycemic index as Dreamfield's.   Brown rice is still rice,  So skip the rice and noodles if you eat Congohinese or [place] (or at least limit to 1/2 cup)  9 PM snack :   Breyer's "low carb" fudgsicle or  ice cream bar (Carb Smart  line), or  Weight Watcher's ice cream bar , or another "no sugar added" ice cream;  a serving of fresh berries/cherries with whipped cream   Cheese or DANNON'S LlGHT N FIT GREEK YOGURT  8 ounces of [place] unsweetened almond/cococunut milk    Avoid bananas, pineapple, grapes  and watermelon on a regular basis because they are high in sugar.  THINK OF THEM AS DESSERT  Remember that snack Substitutions should be less than 10 NET carbs per serving and meals < 20 carbs. Remember to subtract fiber grams to get the "net carbs."

## 2014-03-03 NOTE — Assessment & Plan Note (Addendum)
Well controlled on current regimen. Renal function stable, no changes today.  Lab Results  Component Value Date   CREATININE 0.9 02/20/2014   Lab Results  Component Value Date   NA 139 02/20/2014   K 4.5 02/20/2014   CL 104 02/20/2014   CO2 29 02/20/2014

## 2014-03-04 ENCOUNTER — Telehealth: Payer: Self-pay | Admitting: Internal Medicine

## 2014-03-04 NOTE — Telephone Encounter (Signed)
Relevant patient education assigned to patient using Emmi. ° °

## 2014-03-05 DIAGNOSIS — E669 Obesity, unspecified: Secondary | ICD-10-CM | POA: Insufficient documentation

## 2014-03-05 NOTE — Assessment & Plan Note (Signed)
recommedned trial of red yeast rice 60 mg bid  Lab Results  Component Value Date   CHOL 248* 02/20/2014   HDL 65.40 02/20/2014   LDLCALC 173* 02/20/2014   LDLDIRECT 162.3 04/12/2013   TRIG 47.0 02/20/2014   CHOLHDL 4 02/20/2014

## 2014-03-05 NOTE — Assessment & Plan Note (Signed)
I have addressed  BMI and recommended wt loss of 10% of body weigh over the next 6 months using a low glycemic index diet and regular exercise a minimum of 5 days per week.   

## 2014-03-05 NOTE — Progress Notes (Signed)
Patient ID: Bethany Fernandez, female   DOB: 07/12/1966, 48 y.o.   MRN: [id number] Patient Active Problem List   Diagnosis Date Noted  . Overweight or obesity 03/05/2014  . Internal hemorrhoids 04/19/2013  . Other and unspecified hyperlipidemia 04/19/2013  . Routine general medical examination at a health care facility 04/19/2013  . Breast mass, left 04/19/2013  . History of fibrocystic disease of breast 02/23/2013  . Weight gain, abnormal 01/16/2013  . Abnormal thyroid blood test 01/16/2013  . Essential hypertension, benign 01/14/2013    Subjective:  CC:   Chief Complaint  Patient presents with  . Follow-up  . Hyperlipidemia    HPI:   Bethany Fernandez is a 48 y.o. female who presents for \\follow  up on chronic condition including hyperlipidemiahypertension and obesity.  No new complaints.  Not able to lose weight because she is not exercising or following a modified diet.    Past Medical History  Diagnosis Date  . Fibrocystic breast 2013  . Hypertension 1999  . Chicken pox   . Hyperlipidemia   . Migraines     Past Surgical History  Procedure Laterality Date  . Partial hysterectomy  2012  . Breast biopsy  2013    right  . Abdominal hysterectomy    . Ectopic pregnancy surgery         The following portions of the patient's history were reviewed and updated as appropriate: Allergies, current medications, and problem list.    Review of Systems:   Patient denies headache, fevers, malaise, unintentional weight loss, skin rash, eye pain, sinus congestion and sinus pain, sore throat, dysphagia,  hemoptysis , cough, dyspnea, wheezing, chest pain, palpitations, orthopnea, edema, abdominal pain, nausea, melena, diarrhea, constipation, flank pain, dysuria, hematuria, urinary  Frequency, nocturia, numbness, tingling, seizures,  Focal weakness, Loss of consciousness,  Tremor, insomnia, depression, anxiety, and suicidal ideation.     History   Social History  . Marital Status:  Married    Spouse Name: N/A    Number of Children: N/A  . Years of Education: N/A   Occupational History  . Not on file.   Social History Main Topics  . Smoking status: Never Smoker   . Smokeless tobacco: Never Used  . Alcohol Use: Yes  . Drug Use: No  . Sexual Activity: Not on file   Other Topics Concern  . Not on file   Social History Narrative  . No narrative on file    Objective:  Filed Vitals:   03/03/14 0909  BP: 134/90  Pulse: 77  Temp: 98.5 F (36.9 C)  Resp: 16     General appearance: alert, cooperative and appears stated age Ears: normal TM's and external ear canals both ears Throat: lips, mucosa, and tongue normal; teeth and gums normal Neck: no adenopathy, no carotid bruit, supple, symmetrical, trachea midline and thyroid not enlarged, symmetric, no tenderness/mass/nodules Back: symmetric, no curvature. ROM normal. No CVA tenderness. Lungs: clear to auscultation bilaterally Heart: regular rate and rhythm, S1, S2 normal, no murmur, click, rub or gallop Abdomen: soft, non-tender; bowel sounds normal; no masses,  no organomegaly Pulses: 2+ and symmetric Skin: Skin color, texture, turgor normal. No rashes or lesions Lymph nodes: Cervical, supraclavicular, and axillary nodes normal.  Assessment and Plan:  Essential hypertension, benign Well controlled on current regimen. Renal function stable, no changes today.  Lab Results  Component Value Date   CREATININE 0.9 02/20/2014   Lab Results  Component Value Date   NA 139 02/20/2014   K  4.5 02/20/2014   CL 104 02/20/2014   CO2 29 02/20/2014     Other and unspecified hyperlipidemia recommedned trial of red yeast rice 60 mg bid  Lab Results  Component Value Date   CHOL 248* 02/20/2014   HDL 65.40 02/20/2014   LDLCALC 173* 02/20/2014   LDLDIRECT 162.3 04/12/2013   TRIG 47.0 02/20/2014   CHOLHDL 4 02/20/2014     Overweight or obesity I have addressed  BMI and recommended wt loss of 10% of body weigh over the  next 6 months using a low glycemic index diet and regular exercise a minimum of 5 days per week.     Updated Medication List Outpatient Encounter Prescriptions as of 03/03/2014  Medication Sig  . calcium-vitamin D (OSCAL WITH D) 250-125 MG-UNIT per tablet Take 1 tablet by mouth daily.  . cyanocobalamin 1000 MCG tablet Take 100 mcg by mouth daily.  . Flaxseed, Linseed, (FLAX SEED OIL) 1300 MG CAPS Take 1 capsule by mouth daily.  . [DISCONTINUED] amLODipine (NORVASC) 5 MG tablet Take 1 tablet (5 mg total) by mouth daily.  . furosemide (LASIX) 20 MG tablet Take 1 tablet (20 mg total) by mouth daily. As needed for fluid retention  . Red Yeast Rice Extract 600 MG CAPS Take 1 capsule (600 mg total) by mouth 2 (two) times daily at 10 AM and 5 PM.  . [DISCONTINUED] hydrocortisone (ANUSOL-HC) 25 MG suppository Place 1 suppository (25 mg total) rectally 2 (two) times daily.     No orders of the defined types were placed in this encounter.    Return in about 6 months (around 09/02/2014).

## 2014-03-13 ENCOUNTER — Encounter: Payer: Self-pay | Admitting: General Surgery

## 2014-03-13 ENCOUNTER — Ambulatory Visit (INDEPENDENT_AMBULATORY_CARE_PROVIDER_SITE_OTHER): Payer: BC Managed Care – PPO | Admitting: General Surgery

## 2014-03-13 VITALS — BP 118/80 | HR 74 | Resp 12 | Ht 63.0 in | Wt 165.0 lb

## 2014-03-13 DIAGNOSIS — N6019 Diffuse cystic mastopathy of unspecified breast: Secondary | ICD-10-CM

## 2014-03-13 DIAGNOSIS — Z87898 Personal history of other specified conditions: Secondary | ICD-10-CM

## 2014-03-13 NOTE — Patient Instructions (Addendum)
The patient has been asked to return to the office in one year with a bilateral diagnostic mammogram. Continue monthly self exam. Call for any  new concerns

## 2014-03-13 NOTE — Progress Notes (Signed)
Patient ID: Bethany Fernandez, female   DOB: 07/12/1966, 48 y.o.   MRN: [id number]  Chief Complaint  Patient presents with  . Follow-up    mammogram    HPI Bethany Fernandez is a 48 y.o. female. who presents for a breast evaluation. The most recent mammogram was done on 03/01/14.Patient does perform regular self breast checks and gets regular mammograms done.  No new problems at this time.   HPI  Past Medical History  Diagnosis Date  . Fibrocystic breast 2013  . Hypertension 1999  . Chicken pox   . Hyperlipidemia   . Migraines     Past Surgical History  Procedure Laterality Date  . Partial hysterectomy  2012  . Breast biopsy  2013    right  . Abdominal hysterectomy    . Ectopic pregnancy surgery      Family History  Problem Relation Age of Onset  . Breast cancer Cousin 50  . Lung disease Maternal Grandfather [id number]  . Alcohol abuse Maternal Grandfather   . Hyperlipidemia Mother   . Hypertension Mother   . Hyperlipidemia Father   . Hypertension Father   . Diabetes Father   . Stroke Paternal Grandfather   . Heart disease Sister   . Kidney disease Maternal Uncle     Social History History  Substance Use Topics  . Smoking status: Never Smoker   . Smokeless tobacco: Never Used  . Alcohol Use: Yes    Allergies  Allergen Reactions  . Tape Rash    Current Outpatient Prescriptions  Medication Sig Dispense Refill  . amLODipine (NORVASC) 5 MG tablet Take 1 tablet (5 mg total) by mouth daily.  30 tablet  5  . calcium-vitamin D (OSCAL WITH D) 250-125 MG-UNIT per tablet Take 1 tablet by mouth daily.      . cyanocobalamin 1000 MCG tablet Take 100 mcg by mouth daily.      . Flaxseed, Linseed, (FLAX SEED OIL) 1300 MG CAPS Take 1 capsule by mouth daily.      . furosemide (LASIX) 20 MG tablet Take 1 tablet (20 mg total) by mouth daily. As needed for fluid retention  30 tablet  3  . Red Yeast Rice Extract 600 MG CAPS Take 1 capsule (600 mg total) by mouth 2 (two) times daily at 10 AM and  5 PM.  180 capsule  3   No current facility-administered medications for this visit.    Review of Systems Review of Systems  Constitutional: Negative.   Respiratory: Negative.   Cardiovascular: Negative.     Blood pressure 118/80, pulse 74, resp. rate 12, height 5\' 3"  (1.6 m), weight 165 lb (74.844 kg).  Physical Exam Physical Exam  Constitutional: She is oriented to person, place, and time. She appears well-developed and well-nourished.  Eyes: Conjunctivae are normal. No scleral icterus.  Neck: Neck supple. No thyromegaly present.  Cardiovascular: Normal rate, regular rhythm and normal heart sounds.   No murmur heard. Pulmonary/Chest: Effort normal and breath sounds normal. Right breast exhibits inverted nipple. Right breast exhibits no mass, no nipple discharge, no skin change and no tenderness. Left breast exhibits inverted nipple. Left breast exhibits no mass, no nipple discharge, no skin change and no tenderness.  Sunken nipples bilaterally which is chronic.   Abdominal: Soft. Bowel sounds are normal. There is no tenderness.  Lymphadenopathy:    She has no cervical adenopathy.    She has no axillary adenopathy.  Neurological: She is alert and oriented to person, place,  and time.  Skin: Skin is warm and dry.    Data Reviewed Mammogram reviewed  Assessment    Stable exam. History of FCD.    Plan    The patient has been asked to return to the office in one year with a bilateral screening mammogram.    SANKAR,SEEPLAPUTHUR G 03/14/2014, 12:00 PM

## 2014-03-14 ENCOUNTER — Encounter: Payer: Self-pay | Admitting: General Surgery

## 2014-07-17 ENCOUNTER — Encounter: Payer: Self-pay | Admitting: General Surgery

## 2014-08-28 ENCOUNTER — Telehealth: Payer: Self-pay | Admitting: *Deleted

## 2014-08-28 ENCOUNTER — Other Ambulatory Visit (INDEPENDENT_AMBULATORY_CARE_PROVIDER_SITE_OTHER): Payer: BC Managed Care – PPO

## 2014-08-28 ENCOUNTER — Other Ambulatory Visit: Payer: Self-pay | Admitting: Internal Medicine

## 2014-08-28 DIAGNOSIS — E785 Hyperlipidemia, unspecified: Secondary | ICD-10-CM

## 2014-08-28 LAB — COMPREHENSIVE METABOLIC PANEL
ALT: 17 U/L (ref 0–35)
AST: 21 U/L (ref 0–37)
Albumin: 3.9 g/dL (ref 3.5–5.2)
Alkaline Phosphatase: 63 U/L (ref 39–117)
BILIRUBIN TOTAL: 0.7 mg/dL (ref 0.2–1.2)
BUN: 10 mg/dL (ref 6–23)
CHLORIDE: 104 meq/L (ref 96–112)
CO2: 30 mEq/L (ref 19–32)
Calcium: 9.5 mg/dL (ref 8.4–10.5)
Creatinine, Ser: 0.9 mg/dL (ref 0.4–1.2)
GFR: 86.43 mL/min (ref >60.00)
Glucose, Bld: 93 mg/dL (ref 70–99)
Potassium: 4.2 mEq/L (ref 3.5–5.1)
SODIUM: 139 meq/L (ref 135–145)
TOTAL PROTEIN: 7.1 g/dL (ref 6.0–8.3)

## 2014-08-28 LAB — LIPID PANEL
CHOL/HDL RATIO: 4
Cholesterol: 269 mg/dL — ABNORMAL HIGH (ref 0–200)
HDL: 61.5 mg/dL (ref >39.00)
LDL Cholesterol: 196 mg/dL — ABNORMAL HIGH (ref 0–99)
NONHDL: 207.5
Triglycerides: 59 mg/dL (ref 0.0–149.0)
VLDL: 11.8 mg/dL (ref 0.0–40.0)

## 2014-08-28 NOTE — Telephone Encounter (Signed)
What labs and dx?  

## 2014-08-29 MED ORDER — ATORVASTATIN CALCIUM 20 MG PO TABS: 20.0000 mg | ORAL_TABLET | Freq: Every day | ORAL | Status: DC

## 2014-08-29 NOTE — Addendum Note (Signed)
Addended by: Sherlene ShamsULLO, Tenasia Aull L on: 08/29/2014 03:11 PM   Modules accepted: Orders, Medications

## 2014-08-30 ENCOUNTER — Encounter: Payer: Self-pay | Admitting: Internal Medicine

## 2014-08-30 ENCOUNTER — Ambulatory Visit (INDEPENDENT_AMBULATORY_CARE_PROVIDER_SITE_OTHER): Payer: BC Managed Care – PPO | Admitting: Internal Medicine

## 2014-08-30 VITALS — BP 122/74 | HR 89 | Temp 98.4°F | Resp 16 | Ht 63.0 in | Wt 168.8 lb

## 2014-08-30 DIAGNOSIS — Z90711 Acquired absence of uterus with remaining cervical stump: Secondary | ICD-10-CM

## 2014-08-30 DIAGNOSIS — Z1239 Encounter for other screening for malignant neoplasm of breast: Secondary | ICD-10-CM

## 2014-08-30 DIAGNOSIS — E785 Hyperlipidemia, unspecified: Secondary | ICD-10-CM

## 2014-08-30 DIAGNOSIS — I10 Essential (primary) hypertension: Secondary | ICD-10-CM

## 2014-08-30 DIAGNOSIS — Z0000 Encounter for general adult medical examination without abnormal findings: Secondary | ICD-10-CM

## 2014-08-30 DIAGNOSIS — E7849 Other hyperlipidemia: Secondary | ICD-10-CM

## 2014-08-30 DIAGNOSIS — E669 Obesity, unspecified: Secondary | ICD-10-CM

## 2014-08-30 NOTE — Progress Notes (Signed)
Pre visit review using our clinic review tool, if applicable. No additional management support is needed unless otherwise documented below in the visit note. 

## 2014-08-30 NOTE — Patient Instructions (Signed)

## 2014-08-30 NOTE — Progress Notes (Signed)
Patient ID: Bethany Fernandez, female   DOB: 07/29/1966, 48 y.o.   MRN: [id number]    Subjective:     Bethany Fernandez is a 48 y.o. female and is here for a comprehensive physical exam. The patient reports no new problems.  She has had fasting labs done prior to visit and an LDL of 200 was noted despite trial of red yeast rice. She has familial hyprelipidema , family history and prior and medication history were reviewed. She has tolerated  atorvastaitn in the past.     History   Social History  . Marital Status: Married    Spouse Name: N/A    Number of Children: N/A  . Years of Education: N/A   Occupational History  . Not on file.   Social History Main Topics  . Smoking status: Never Smoker   . Smokeless tobacco: Never Used  . Alcohol Use: Yes  . Drug Use: No  . Sexual Activity: Not on file   Other Topics Concern  . Not on file   Social History Narrative   Health Maintenance  Topic Date Due  . INFLUENZA VACCINE  04/15/2014  . PAP SMEAR  04/30/2016  . TETANUS/TDAP  04/20/2023    The following portions of the patient's history were reviewed and updated as appropriate: allergies, current medications, past family history, past medical history, past social history, past surgical history and problem list.  Review of Systems A comprehensive review of systems was negative.   Objective:  BP 122/74 mmHg  Pulse 89  Temp(Src) 98.4 F (36.9 C) (Oral)  Resp 16  Ht 5\' 3"  (1.6 m)  Wt 168 lb 12.8 oz (76.567 kg)  BMI 29.91 kg/m2  SpO2 98%   General appearance: alert, cooperative and appears stated age Head: Normocephalic, without obvious abnormality, atraumatic Eyes: conjunctivae/corneas clear. PERRL, EOM's intact. Fundi benign. Ears: normal TM's and external ear canals both ears Nose: Nares normal. Septum midline. Mucosa normal. No drainage or sinus tenderness. Throat: lips, mucosa, and tongue normal; teeth and gums normal Neck: no adenopathy, no carotid bruit, no JVD, supple,  symmetrical, trachea midline and thyroid not enlarged, symmetric, no tenderness/mass/nodules Lungs: clear to auscultation bilaterally Breasts: normal appearance, no masses or tenderness Heart: regular rate and rhythm, S1, S2 normal, no murmur, click, rub or gallop Abdomen: soft, non-tender; bowel sounds normal; no masses,  no organomegaly Extremities: extremities normal, atraumatic, no cyanosis or edema Pulses: 2+ and symmetric Skin: Skin color, texture, turgor normal. No rashes or lesions Neurologic: Alert and oriented X 3, normal strength and tone. Normal symmetric reflexes. Normal coordination and gait.    Assessment and Plan:   Problem List Items Addressed This Visit      Cardiovascular and Mediastinum   Essential hypertension, benign    Well controlled on current regimen. Renal function stable, no changes today.  Lab Results  Component Value Date   CREATININE 0.9 08/28/2014   Lab Results  Component Value Date   NA 139 08/28/2014   K 4.2 08/28/2014   CL 104 08/28/2014   CO2 30 08/28/2014         Other   Encounter for preventive health examination - Primary    Annual wellness  exam was done as well as a comprehensive physical exam and management of acute and chronic conditions .  During the course of the visit the patient was educated and counseled about appropriate screening and preventive services including :  diabetes screening, lipid analysis with projected  10 year  risk for CAD , nutrition counseling, colorectal cancer screening, and recommended immunizations.  Printed recommendations for health maintenance screenings was given.     Familial hyperlipidemia, high LDL    Her LDL is 196 despite a trial of red yeast rice 600 mg bid.  Discussed resuming atorvastatin  Given history of hypertension and FH.    Lab Results  Component Value Date   CHOL 269* 08/28/2014   HDL 61.50 08/28/2014   LDLCALC 196* 08/28/2014   LDLDIRECT 162.3 04/12/2013   TRIG 59.0 08/28/2014    CHOLHDL 4 08/28/2014         Obesity    I have addressed  BMI and recommended wt loss of 10% of body weigh over the next 6 months using a low glycemic index diet and regular exercise a minimum of 5 days per week.    Wt Readings from Last 3 Encounters:  08/30/14 168 lb 12.8 oz (76.567 kg)  03/13/14 165 lb (74.844 kg)  03/03/14 164 lb 12 oz (74.73 kg)      S/P abdominal supracervical subtotal hysterectomy    Other Visit Diagnoses    Screening for breast cancer

## 2014-09-02 ENCOUNTER — Encounter: Payer: Self-pay | Admitting: Internal Medicine

## 2014-09-02 DIAGNOSIS — Z90711 Acquired absence of uterus with remaining cervical stump: Secondary | ICD-10-CM | POA: Insufficient documentation

## 2014-09-02 NOTE — Assessment & Plan Note (Signed)
I have addressed  BMI and recommended wt loss of 10% of body weigh over the next 6 months using a low glycemic index diet and regular exercise a minimum of 5 days per week.    Wt Readings from Last 3 Encounters:  08/30/14 168 lb 12.8 oz (76.567 kg)  03/13/14 165 lb (74.844 kg)  03/03/14 164 lb 12 oz (74.73 kg)

## 2014-09-02 NOTE — Assessment & Plan Note (Signed)
Her LDL is 196 despite a trial of red yeast rice 600 mg bid.  Discussed resuming atorvastatin  Given history of hypertension and FH.    Lab Results  Component Value Date   CHOL 269* 08/28/2014   HDL 61.50 08/28/2014   LDLCALC 196* 08/28/2014   LDLDIRECT 162.3 04/12/2013   TRIG 59.0 08/28/2014   CHOLHDL 4 08/28/2014

## 2014-09-02 NOTE — Assessment & Plan Note (Signed)

## 2014-09-02 NOTE — Assessment & Plan Note (Signed)
Well controlled on current regimen. Renal function stable, no changes today.  Lab Results  Component Value Date   CREATININE 0.9 08/28/2014   Lab Results  Component Value Date   NA 139 08/28/2014   K 4.2 08/28/2014   CL 104 08/28/2014   CO2 30 08/28/2014

## 2014-09-20 ENCOUNTER — Other Ambulatory Visit: Payer: Self-pay | Admitting: *Deleted

## 2014-09-20 MED ORDER — AMLODIPINE BESYLATE 5 MG PO TABS: 5.0000 mg | ORAL_TABLET | Freq: Every day | ORAL | Status: DC

## 2014-09-20 NOTE — Progress Notes (Signed)
Refill for BP medication sent to new pharmacy

## 2014-11-14 ENCOUNTER — Encounter (INDEPENDENT_AMBULATORY_CARE_PROVIDER_SITE_OTHER): Payer: Self-pay

## 2014-11-14 ENCOUNTER — Ambulatory Visit (INDEPENDENT_AMBULATORY_CARE_PROVIDER_SITE_OTHER): Payer: BLUE CROSS/BLUE SHIELD | Admitting: Nurse Practitioner

## 2014-11-14 ENCOUNTER — Encounter: Payer: Self-pay | Admitting: Nurse Practitioner

## 2014-11-14 VITALS — BP 130/82 | HR 71 | Temp 98.2°F | Resp 12 | Ht 63.0 in | Wt 168.8 lb

## 2014-11-14 DIAGNOSIS — B9789 Other viral agents as the cause of diseases classified elsewhere: Principal | ICD-10-CM

## 2014-11-14 DIAGNOSIS — J069 Acute upper respiratory infection, unspecified: Secondary | ICD-10-CM

## 2014-11-14 MED ORDER — HYDROCOD POLST-CHLORPHEN POLST 10-8 MG/5ML PO LQCR: 5.0000 mL | Freq: Every evening | ORAL | Status: DC | PRN

## 2014-11-14 NOTE — Patient Instructions (Addendum)
Continue with Zyrtec daily.   Add cough syrup at night (1 teaspoon) and it will make you sleepy.   Consider using simply Saline to flush your sinuses twice daily when you have congestion to prevent sinus infections.   Call us by Friday if worsening/failure to improve.

## 2014-11-14 NOTE — Progress Notes (Signed)
   Subjective:    Patient ID: Bethany Fernandez, female    DOB: 09/08/1966, 49 y.o.   MRN: [id number]  HPI  Bethany Fernandez is a 49 yo female with a CC of rhinorrhea and PNDrip x 2 weeks.   1) Started with runny nose and sneezing. Thought it cleared up when she stopped sneezing. Started back last Tuesday with drainage.  Zyrtec x 2 days. No other treatment to date.   Review of Systems  Constitutional: Negative for fever, chills, diaphoresis and fatigue.  HENT: Positive for postnasal drip and rhinorrhea. Negative for congestion, sinus pressure, sneezing and sore throat.   Eyes: Negative for visual disturbance.  Respiratory: Positive for cough. Negative for chest tightness, shortness of breath and wheezing.   Cardiovascular: Negative for chest pain, palpitations and leg swelling.  Gastrointestinal: Negative for nausea, vomiting and diarrhea.  Skin: Negative for rash.  Neurological: Negative for dizziness, weakness, numbness and headaches.  Psychiatric/Behavioral: The patient is not nervous/anxious.        Objective:   Physical Exam  Constitutional: She is oriented to person, place, and time. She appears well-developed and well-nourished. No distress.  BP 130/82 mmHg  Pulse 71  Temp(Src) 98.2 F (36.8 C) (Oral)  Resp 12  Ht 5\' 3"  (1.6 m)  Wt 168 lb 12.8 oz (76.567 kg)  BMI 29.91 kg/m2  SpO2 97%   HENT:  Head: Normocephalic and atraumatic.  Right Ear: External ear normal.  Left Ear: External ear normal.  Mouth/Throat: No oropharyngeal exudate.  TM's clear bilaterally  Eyes: Conjunctivae and EOM are normal. Pupils are equal, round, and reactive to light. Right eye exhibits no discharge. Left eye exhibits no discharge. No scleral icterus.  Neck: Normal range of motion. Neck supple. No thyromegaly present.  Cardiovascular: Normal rate, regular rhythm, normal heart sounds and intact distal pulses.  Exam reveals no gallop and no friction rub.   No murmur heard. Pulmonary/Chest: Effort  normal and breath sounds normal. No respiratory distress. She has no wheezes. She has no rales. She exhibits no tenderness.  Lymphadenopathy:    She has no cervical adenopathy.  Neurological: She is alert and oriented to person, place, and time. No cranial nerve deficit. She exhibits normal muscle tone. Coordination normal.  Skin: Skin is warm and dry. No rash noted. She is not diaphoretic.  Psychiatric: She has a normal mood and affect. Her behavior is normal. Judgment and thought content normal.      Assessment & Plan:  1) Viral URI with cough Will try simply saline nasal spray  Zyrtec continued daily Tussionex cough syrup  Call if worsening/failure to improve

## 2014-11-14 NOTE — Progress Notes (Signed)
Pre visit review using our clinic review tool, if applicable. No additional management support is needed unless otherwise documented below in the visit note. 

## 2014-11-28 ENCOUNTER — Telehealth: Payer: Self-pay | Admitting: *Deleted

## 2014-11-28 DIAGNOSIS — E785 Hyperlipidemia, unspecified: Secondary | ICD-10-CM

## 2014-11-28 DIAGNOSIS — Z79899 Other long term (current) drug therapy: Secondary | ICD-10-CM

## 2014-11-28 NOTE — Addendum Note (Signed)
Addended by: Sherlene ShamsULLO, Hridaan Bouse L on: 11/28/2014 05:12 PM   Modules accepted: Orders

## 2014-11-28 NOTE — Telephone Encounter (Signed)
Pt coming tomorrow what labs and dx? 

## 2014-11-29 ENCOUNTER — Other Ambulatory Visit: Payer: BC Managed Care – PPO

## 2015-01-12 ENCOUNTER — Other Ambulatory Visit: Payer: Self-pay

## 2015-01-12 DIAGNOSIS — Z1231 Encounter for screening mammogram for malignant neoplasm of breast: Secondary | ICD-10-CM

## 2015-03-01 ENCOUNTER — Ambulatory Visit: Payer: BLUE CROSS/BLUE SHIELD | Admitting: Internal Medicine

## 2015-03-12 ENCOUNTER — Ambulatory Visit: Payer: Self-pay | Admitting: General Surgery

## 2015-03-20 ENCOUNTER — Other Ambulatory Visit: Payer: Self-pay | Admitting: Internal Medicine

## 2015-03-20 ENCOUNTER — Ambulatory Visit (INDEPENDENT_AMBULATORY_CARE_PROVIDER_SITE_OTHER): Payer: BLUE CROSS/BLUE SHIELD | Admitting: Internal Medicine

## 2015-03-20 ENCOUNTER — Encounter: Payer: Self-pay | Admitting: Internal Medicine

## 2015-03-20 VITALS — BP 107/73 | HR 77 | Temp 97.7°F | Ht 63.0 in | Wt 165.1 lb

## 2015-03-20 DIAGNOSIS — E7849 Other hyperlipidemia: Secondary | ICD-10-CM

## 2015-03-20 DIAGNOSIS — IMO0001 Reserved for inherently not codable concepts without codable children: Secondary | ICD-10-CM

## 2015-03-20 DIAGNOSIS — I10 Essential (primary) hypertension: Secondary | ICD-10-CM | POA: Diagnosis not present

## 2015-03-20 DIAGNOSIS — Z79899 Other long term (current) drug therapy: Secondary | ICD-10-CM | POA: Diagnosis not present

## 2015-03-20 DIAGNOSIS — M791 Myalgia: Secondary | ICD-10-CM | POA: Diagnosis not present

## 2015-03-20 DIAGNOSIS — E785 Hyperlipidemia, unspecified: Secondary | ICD-10-CM

## 2015-03-20 DIAGNOSIS — E663 Overweight: Secondary | ICD-10-CM

## 2015-03-20 DIAGNOSIS — M609 Myositis, unspecified: Secondary | ICD-10-CM | POA: Diagnosis not present

## 2015-03-20 LAB — COMPREHENSIVE METABOLIC PANEL
ALT: 21 U/L (ref 0–35)
AST: 21 U/L (ref 0–37)
Albumin: 4.1 g/dL (ref 3.5–5.2)
Alkaline Phosphatase: 74 U/L (ref 39–117)
BUN: 14 mg/dL (ref 6–23)
CHLORIDE: 103 meq/L (ref 96–112)
CO2: 31 meq/L (ref 19–32)
CREATININE: 1.2 mg/dL (ref 0.40–1.20)
Calcium: 9.9 mg/dL (ref 8.4–10.5)
GFR: 61.86 mL/min (ref >60.00)
Glucose, Bld: 91 mg/dL (ref 70–99)
POTASSIUM: 3.8 meq/L (ref 3.5–5.1)
Sodium: 141 mEq/L (ref 135–145)
Total Bilirubin: 0.7 mg/dL (ref 0.2–1.2)
Total Protein: 7.7 g/dL (ref 6.0–8.3)

## 2015-03-20 LAB — LIPID PANEL
Cholesterol: 169 mg/dL (ref 0–200)
HDL: 53.7 mg/dL (ref >39.00)
LDL Cholesterol: 104 mg/dL — ABNORMAL HIGH (ref 0–99)
NonHDL: 115.3
Total CHOL/HDL Ratio: 3
Triglycerides: 59 mg/dL (ref 0.0–149.0)
VLDL: 11.8 mg/dL (ref 0.0–40.0)

## 2015-03-20 LAB — CK: CK TOTAL: 101 U/L (ref 7–177)

## 2015-03-20 NOTE — Patient Instructions (Signed)

## 2015-03-20 NOTE — Assessment & Plan Note (Addendum)
No improvement with RYR.  Has been taking lipitor for 6 months, and LDL is now 104  Lab Results  Component Value Date   CHOL 169 03/20/2015   HDL 53.70 03/20/2015   LDLCALC 104* 03/20/2015   LDLDIRECT 162.3 04/12/2013   TRIG 59.0 03/20/2015   CHOLHDL 3 03/20/2015   Lab Results  Component Value Date   ALT 21 03/20/2015   AST 21 03/20/2015   ALKPHOS 74 03/20/2015   BILITOT 0.7 03/20/2015

## 2015-03-20 NOTE — Progress Notes (Signed)
Subjective:  Patient ID: Bethany Fernandez, female    DOB: July 29, 1966  Age: 49 y.o. MRN: [id number]  CC: The primary encounter diagnosis was Myalgia and myositis. Diagnoses of Encounter for long-term (current) use of other high-risk medications, Hyperlipidemia, Essential hypertension, benign, Familial hyperlipidemia, high LDL, and Overweight (BMI 25.0-29.9) were also pertinent to this visit.  HPI [name] presents for 6 month follow up on hypertension, hyperlipidemia and overweight.  Tolerating Lipitor added in December for LDL > 160.  Had been on it before, but trial of Red Yeast rice was not effective.  A few muscle aches, not sure if it's from doing squats or from medication.  Npt exercising regularly  Personal best was 154 lbs during a period of supervised training program by Genene Churn at [employer].  Has lost her momentum.     Outpatient Prescriptions Prior to Visit  Medication Sig Dispense Refill  . atorvastatin (LIPITOR) 20 MG tablet Take 1 tablet (20 mg total) by mouth daily. 90 tablet 3  . calcium-vitamin D (OSCAL WITH D) 250-125 MG-UNIT per tablet Take 1 tablet by mouth daily.    . chlorpheniramine-HYDROcodone (TUSSIONEX) 10-8 MG/5ML LQCR Take 5 mLs by mouth at bedtime as needed for cough. 115 mL 0  . cyanocobalamin 1000 MCG tablet Take 100 mcg by mouth daily.    . furosemide (LASIX) 20 MG tablet Take 1 tablet (20 mg total) by mouth daily. As needed for fluid retention 30 tablet 3  . amLODipine (NORVASC) 5 MG tablet Take 1 tablet (5 mg total) by mouth daily. 30 tablet 5  . Flaxseed, Linseed, (FLAX SEED OIL) 1300 MG CAPS Take 1 capsule by mouth daily.    . Red Yeast Rice Extract 600 MG CAPS Take 1 capsule (600 mg total) by mouth 2 (two) times daily at 10 AM and 5 PM. 180 capsule 3   No facility-administered medications prior to visit.    Review of Systems;  Patient denies headache, fevers, malaise, unintentional weight loss, skin rash, eye pain, sinus congestion and  sinus pain, sore throat, dysphagia,  hemoptysis , cough, dyspnea, wheezing, chest pain, palpitations, orthopnea, edema, abdominal pain, nausea, melena, diarrhea, constipation, flank pain, dysuria, hematuria, urinary  Frequency, nocturia, numbness, tingling, seizures,  Focal weakness, Loss of consciousness,  Tremor, insomnia, depression, anxiety, and suicidal ideation.      Objective:  BP 107/73 mmHg  Pulse 77  Temp(Src) 97.7 F (36.5 C) (Oral)  Ht  (1.6 m)  Wt 165 lb 2 oz (74.9 kg)  BMI 29.26 kg/m2  SpO2 97%  BP Readings from Last 3 Encounters:  03/20/15 107/73  11/14/14 130/82  08/30/14 122/74    Wt Readings from Last 3 Encounters:  03/20/15 165 lb 2 oz (74.9 kg)  11/14/14 168 lb 12.8 oz (76.567 kg)  08/30/14 168 lb 12.8 oz (76.567 kg)    General appearance: alert, cooperative and appears stated age Ears: normal TM's and external ear canals both ears Throat: lips, mucosa, and tongue normal; teeth and gums normal Neck: no adenopathy, no carotid bruit, supple, symmetrical, trachea midline and thyroid not enlarged, symmetric, no tenderness/mass/nodules Back: symmetric, no curvature. ROM normal. No CVA tenderness. Lungs: clear to auscultation bilaterally Heart: regular rate and rhythm, S1, S2 normal, no murmur, click, rub or gallop Abdomen: soft, non-tender; bowel sounds normal; no masses,  no organomegaly Pulses: 2+ and symmetric Skin: Skin color, texture, turgor normal. No rashes or lesions Lymph nodes: Cervical, supraclavicular, and axillary nodes normal.  No results  found for: HGBA1C  Lab Results  Component Value Date   CREATININE 1.20 03/20/2015   CREATININE 0.9 08/28/2014   CREATININE 0.9 02/20/2014    Lab Results  Component Value Date   GLUCOSE 91 03/20/2015   CHOL 169 03/20/2015   TRIG 59.0 03/20/2015   HDL 53.70 03/20/2015   LDLDIRECT 162.3 04/12/2013   LDLCALC 104* 03/20/2015   ALT 21 03/20/2015   AST 21 03/20/2015   NA 141 03/20/2015   K 3.8  03/20/2015   CL 103 03/20/2015   CREATININE 1.20 03/20/2015   BUN 14 03/20/2015   CO2 31 03/20/2015   TSH 0.51 04/12/2013   MICROALBUR 2.5* 04/12/2013    No results found.  Assessment & Plan:   Problem List Items Addressed This Visit      Unprioritized   Essential hypertension, benign    Well controlled on current regimen of amlodipine and furosemide. No edema. Renal function has changed,  Cr up to 1.2, likely due to daily use of furosemide .  Will reasssess in 1 week after suspending furosemide  Lab Results  Component Value Date   CREATININE 1.20 03/20/2015   Lab Results  Component Value Date   NA 141 03/20/2015   K 3.8 03/20/2015   CL 103 03/20/2015   CO2 31 03/20/2015         Familial hyperlipidemia, high LDL    No improvement with RYR.  Has been taking lipitor for 6 months, and LDL is now 104  Lab Results  Component Value Date   CHOL 169 03/20/2015   HDL 53.70 03/20/2015   LDLCALC 104* 03/20/2015   LDLDIRECT 162.3 04/12/2013   TRIG 59.0 03/20/2015   CHOLHDL 3 03/20/2015   Lab Results  Component Value Date   ALT 21 03/20/2015   AST 21 03/20/2015   ALKPHOS 74 03/20/2015   BILITOT 0.7 03/20/2015         Overweight (BMI 25.0-29.9)    I have addressed  BMI and recommended a low glycemic index diet utilizing smaller more frequent meals to increase metabolism.  I have also recommended that patient start exercising with a goal of 30 minutes of aerobic exercise a minimum of 5 days per week.         Other Visit Diagnoses    Myalgia and myositis    -  Primary    Relevant Orders    CK (Creatine Kinase) (Completed)    Encounter for long-term (current) use of other high-risk medications        Hyperlipidemia           I have discontinued Ms. Fallen's Flax Seed Oil and Red Yeast Rice Extract. I am also having her maintain her cyanocobalamin, calcium-vitamin D, furosemide, atorvastatin, and chlorpheniramine-HYDROcodone.  No orders of the defined types were  placed in this encounter.    Medications Discontinued During This Encounter  Medication Reason  . Red Yeast Rice Extract 600 MG CAPS Patient Preference  . Flaxseed, Linseed, (FLAX SEED OIL) 1300 MG CAPS Patient Preference    Follow-up: Return in about 6 months (around 09/20/2015).   Sherlene ShamsULLO, Marialuiza Car L, MD

## 2015-03-20 NOTE — Assessment & Plan Note (Addendum)
Well controlled on current regimen of amlodipine and furosemide. No edema. Renal function has changed,  Cr up to 1.2, likely due to daily use of furosemide .  Will reasssess in 1 week after suspending furosemide  Lab Results  Component Value Date   CREATININE 1.20 03/20/2015   Lab Results  Component Value Date   NA 141 03/20/2015   K 3.8 03/20/2015   CL 103 03/20/2015   CO2 31 03/20/2015

## 2015-03-20 NOTE — Progress Notes (Signed)
Pre visit review using our clinic review tool, if applicable. No additional management support is needed unless otherwise documented below in the visit note. 

## 2015-03-20 NOTE — Assessment & Plan Note (Signed)
I have addressed  BMI and recommended a low glycemic index diet utilizing smaller more frequent meals to increase metabolism.  I have also recommended that patient start exercising with a goal of 30 minutes of aerobic exercise a minimum of 5 days per week.  

## 2015-03-23 ENCOUNTER — Other Ambulatory Visit: Payer: Self-pay | Admitting: Internal Medicine

## 2015-03-23 ENCOUNTER — Encounter: Payer: Self-pay | Admitting: *Deleted

## 2015-04-25 ENCOUNTER — Encounter: Payer: Self-pay | Admitting: General Surgery

## 2015-04-25 ENCOUNTER — Ambulatory Visit (INDEPENDENT_AMBULATORY_CARE_PROVIDER_SITE_OTHER): Payer: BLUE CROSS/BLUE SHIELD | Admitting: General Surgery

## 2015-04-25 VITALS — BP 124/80 | HR 82 | Resp 12 | Ht 63.0 in | Wt 166.0 lb

## 2015-04-25 DIAGNOSIS — Z87898 Personal history of other specified conditions: Secondary | ICD-10-CM | POA: Diagnosis not present

## 2015-04-25 NOTE — Progress Notes (Signed)
Patient ID: Bethany Fernandez, female   DOB: 1965/09/25, 49 y.o.   MRN: [id number]  Chief Complaint  Patient presents with  . Follow-up    mammogram    HPI HOPIE Bethany Fernandez is a 49 y.o. female who presents for a breast evaluation. The most recent mammogram was done on 03/23/15 Patient does perform regular self breast checks and gets regular mammograms done.    HPI  Past Medical History  Diagnosis Date  . Fibrocystic breast 2013  . Hypertension 1999  . Chicken pox   . Hyperlipidemia   . Migraines     Past Surgical History  Procedure Laterality Date  . Partial hysterectomy  2012  . Breast biopsy  2013    right  . Abdominal hysterectomy    . Ectopic pregnancy surgery      Family History  Problem Relation Age of Onset  . Breast cancer Cousin 50  . Lung disease Maternal Grandfather 88  . Alcohol abuse Maternal Grandfather   . Hyperlipidemia Mother   . Hypertension Mother   . Hyperlipidemia Father   . Hypertension Father   . Diabetes Father   . Stroke Paternal Grandfather   . Kidney disease Maternal Uncle   . Heart disease Maternal Aunt 20    massive AMI, died    Social History Social History  Substance Use Topics  . Smoking status: Never Smoker   . Smokeless tobacco: Never Used  . Alcohol Use: Yes    Allergies  Allergen Reactions  . Tape Rash    Current Outpatient Prescriptions  Medication Sig Dispense Refill  . amLODipine (NORVASC) 5 MG tablet TAKE 1 TABLET (5 MG TOTAL) BY MOUTH DAILY. 30 tablet 5  . atorvastatin (LIPITOR) 20 MG tablet Take 1 tablet (20 mg total) by mouth daily. 90 tablet 3  . calcium-vitamin D (OSCAL WITH D) 250-125 MG-UNIT per tablet Take 1 tablet by mouth daily.    . cyanocobalamin 1000 MCG tablet Take 100 mcg by mouth daily.    . furosemide (LASIX) 20 MG tablet TAKE 1 TABLET BY MOUTH EVERY DAY AS NEEDED FOR FLUID 30 tablet 1  . Multiple Vitamin (MULTI VITAMIN DAILY PO) Take by mouth.     No current facility-administered medications for this  visit.    Review of Systems Review of Systems  Constitutional: Negative.   Respiratory: Negative.   Cardiovascular: Negative.     Blood pressure 124/80, pulse 82, resp. rate 12, height 5\' 3"  (1.6 m), weight 166 lb (75.297 kg).  Physical Exam Physical Exam  Constitutional: She is oriented to person, place, and time. She appears well-developed and well-nourished.  Eyes: Conjunctivae are normal. No scleral icterus.  Neck: Neck supple.  Cardiovascular: Normal rate, regular rhythm and normal heart sounds.   Pulmonary/Chest: Effort normal and breath sounds normal. Right breast exhibits no inverted nipple, no mass, no nipple discharge, no skin change and no tenderness. Left breast exhibits no inverted nipple, no mass, no nipple discharge, no skin change and no tenderness.  Abdominal: Soft. Normal appearance. There is no tenderness.  Lymphadenopathy:    She has no cervical adenopathy.    She has no axillary adenopathy.  Neurological: She is alert and oriented to person, place, and time.  Skin: Skin is warm and dry.    Data Reviewed Mammogram reviewed   Assessment       History of fibrocystic disease of breast.Remote FH of breast cancer. Stable exam.   Plan    Patient will be asked to  return to the office in one year with a bilateral screening mammogram       Kalen Neidert G 04/25/2015, 1:17 PM

## 2015-04-25 NOTE — Patient Instructions (Addendum)
Patient will be asked to return to the office in one year with a bilateral screening mammogram.  Continue self breast exams. Call office for any new breast issues or concerns.  

## 2015-05-21 ENCOUNTER — Other Ambulatory Visit: Payer: Self-pay | Admitting: Internal Medicine

## 2015-05-28 ENCOUNTER — Other Ambulatory Visit: Payer: Self-pay

## 2015-05-28 ENCOUNTER — Encounter: Payer: Self-pay | Admitting: General Surgery

## 2015-05-28 MED ORDER — AMLODIPINE BESYLATE 5 MG PO TABS: ORAL_TABLET | ORAL | Status: DC

## 2015-05-28 MED ORDER — FUROSEMIDE 20 MG PO TABS: ORAL_TABLET | ORAL | Status: DC

## 2015-08-18 ENCOUNTER — Other Ambulatory Visit: Payer: Self-pay | Admitting: Internal Medicine

## 2015-09-25 ENCOUNTER — Other Ambulatory Visit: Payer: BLUE CROSS/BLUE SHIELD

## 2015-09-26 ENCOUNTER — Telehealth: Payer: Self-pay | Admitting: Internal Medicine

## 2015-09-26 ENCOUNTER — Other Ambulatory Visit (INDEPENDENT_AMBULATORY_CARE_PROVIDER_SITE_OTHER): Payer: BLUE CROSS/BLUE SHIELD

## 2015-09-26 DIAGNOSIS — Z79899 Other long term (current) drug therapy: Secondary | ICD-10-CM

## 2015-09-26 LAB — COMPREHENSIVE METABOLIC PANEL
ALT: 23 U/L (ref 0–35)
AST: 20 U/L (ref 0–37)
Albumin: 4.4 g/dL (ref 3.5–5.2)
Alkaline Phosphatase: 85 U/L (ref 39–117)
BILIRUBIN TOTAL: 0.6 mg/dL (ref 0.2–1.2)
BUN: 9 mg/dL (ref 6–23)
CALCIUM: 9.6 mg/dL (ref 8.4–10.5)
CHLORIDE: 103 meq/L (ref 96–112)
CO2: 36 meq/L — AB (ref 19–32)
Creatinine, Ser: 0.85 mg/dL (ref 0.40–1.20)
GFR: 91.9 mL/min (ref >60.00)
Glucose, Bld: 102 mg/dL — ABNORMAL HIGH (ref 70–99)
Potassium: 4.4 mEq/L (ref 3.5–5.1)
Sodium: 143 mEq/L (ref 135–145)
Total Protein: 7.1 g/dL (ref 6.0–8.3)

## 2015-09-26 NOTE — Telephone Encounter (Signed)
What labs and DX 

## 2015-09-27 ENCOUNTER — Encounter: Payer: Self-pay | Admitting: Internal Medicine

## 2015-09-27 ENCOUNTER — Ambulatory Visit (INDEPENDENT_AMBULATORY_CARE_PROVIDER_SITE_OTHER): Payer: BLUE CROSS/BLUE SHIELD | Admitting: Internal Medicine

## 2015-09-27 VITALS — BP 122/80 | HR 74 | Temp 98.9°F | Ht 62.25 in | Wt 167.0 lb

## 2015-09-27 DIAGNOSIS — E669 Obesity, unspecified: Secondary | ICD-10-CM

## 2015-09-27 DIAGNOSIS — I10 Essential (primary) hypertension: Secondary | ICD-10-CM

## 2015-09-27 DIAGNOSIS — Z0000 Encounter for general adult medical examination without abnormal findings: Secondary | ICD-10-CM | POA: Diagnosis not present

## 2015-09-27 DIAGNOSIS — R635 Abnormal weight gain: Secondary | ICD-10-CM

## 2015-09-27 MED ORDER — AMLODIPINE BESYLATE 5 MG PO TABS: ORAL_TABLET | ORAL | Status: DC

## 2015-09-27 NOTE — Progress Notes (Signed)
Pre visit review using our clinic review tool, if applicable. No additional management support is needed unless otherwise documented below in the visit note. 

## 2015-09-27 NOTE — Patient Instructions (Addendum)
Danton Clap now makes a frozen breakfast frittata that can be microwaved in 2 minutes and is very low carb. Frittats are similar to quiches without the crust   You may want to try the "Orgain"  Brand of organic almond milk because it has more protein (10 mg per 8 ounce, compared to 1 gram/8 ounce) than the other brands of almond milk . It has the same amount of calcium as a glass of milk and is cholesterol free and low calorie.  Its available at [facility] and at the Co [employer]   The medication  We discussed is phentermine.    If you decide to start it ,  You'll need to get your BP and pulse checked after a week .    The  diet I discussed with you today is the 10 day Green Smoothie Cleansing /Detox Diet by Linden Dolin . available on [place] for around $10.  This is not a low carb or a weight loss diet,  It is fundamentally a "cleansing" low fat diet that eliminates sugar, gluten, caffeine, alcohol and dairy for 10 days .  What you add back after the initial ten days is entirely up to  you!  You can expect to lose 5 to 10 lbs depending on how strict you are.   I found that  drinking 2 smoothies or juices  daily and keeping one chewable meal (but keep it simple, like baked fish and salad, rice or bok choy) kept me satisfied and kept me from straying  .  You snack primarily on fresh  fruit, egg whites and judicious quantities of nuts. I  Recommend adding a  vegetable based protein powder  To any smoothie made with almond milk.  (nothing with whey , since whey is dairy) in it.  [employer] has a [employer], political party .   It does require some form of a nutrient extractor (Vita Mix, a electric juicer,  Or a Nutribullet Rx).  i have found that using frozen fruits is much more convenient and cost effective. You can even find plenty of organic fruit in the frozen fruit section of [employer].  Just thaw what you need for the following day the night before in the refrigerator (to avoid jamming up your machine)    The organic greens  drink I use if I don't have any fresh greens  is called "Suja" and it's sold in the vegetable refrigerated section of most grocery stores (including [employer])  . It is tart, though, so be careful (has lemon juice in it )  The organic vegan protein powder I tried  is called Vega" and I found it at [employer] .  It is sugar free   Menopause is a normal process in which your reproductive ability comes to an end. This process happens gradually over a span of months to years, usually between the ages of 70 and 72. Menopause is complete when you have missed 12 consecutive menstrual periods. It is important to talk with your health care provider about some of the most common conditions that affect postmenopausal women, such as heart disease, cancer, and bone loss (osteoporosis). Adopting a healthy lifestyle and getting preventive care can help to promote your health and wellness. Those actions can also lower your chances of developing some of these common conditions. WHAT SHOULD I KNOW ABOUT MENOPAUSE? During menopause, you may experience a number of symptoms, such as:  Moderate-to-severe hot flashes.  Night sweats.  Decrease in sex drive.  Mood swings.  Headaches.  Tiredness.  Irritability.  Memory problems.  Insomnia. Choosing to treat or not to treat menopausal changes is an individual decision that you make with your health care provider. WHAT SHOULD I KNOW ABOUT HORMONE REPLACEMENT THERAPY AND SUPPLEMENTS? Hormone therapy products are effective for treating symptoms that are associated with menopause, such as hot flashes and night sweats. Hormone replacement carries certain risks, especially as you become older. If you are thinking about using estrogen or estrogen with progestin treatments, discuss the benefits and risks with your health care provider. WHAT SHOULD I KNOW ABOUT HEART DISEASE AND STROKE? Heart disease, heart attack, and stroke become more likely as you age. This may be due, in  part, to the hormonal changes that your body experiences during menopause. These can affect how your body processes dietary fats, triglycerides, and cholesterol. Heart attack and stroke are both medical emergencies. There are many things that you can do to help prevent heart disease and stroke:  Have your blood pressure checked at least every 1-2 years. High blood pressure causes heart disease and increases the risk of stroke.  If you are 68-76 years old, ask your health care provider if you should take aspirin to prevent a heart attack or a stroke.  Do not use any tobacco products, including cigarettes, chewing tobacco, or electronic cigarettes. If you need help quitting, ask your health care provider.  It is important to eat a healthy diet and maintain a healthy weight.  Be sure to include plenty of vegetables, fruits, low-fat dairy products, and lean protein.  Avoid eating foods that are high in solid fats, added sugars, or salt (sodium).  Get regular exercise. This is one of the most important things that you can do for your health.  Try to exercise for at least 150 minutes each week. The type of exercise that you do should increase your heart rate and make you sweat. This is known as moderate-intensity exercise.  Try to do strengthening exercises at least twice each week. Do these in addition to the moderate-intensity exercise.  Know your numbers.Ask your health care provider to check your cholesterol and your blood glucose. Continue to have your blood tested as directed by your health care provider. WHAT SHOULD I KNOW ABOUT CANCER SCREENING? There are several types of cancer. Take the following steps to reduce your risk and to catch any cancer development as early as possible. Breast Cancer  Practice breast self-awareness.  This means understanding how your breasts normally appear and feel.  It also means doing regular breast self-exams. Let your health care provider know about  any changes, no matter how small.  If you are 37 or older, have a clinician do a breast exam (clinical breast exam or CBE) every year. Depending on your age, family history, and medical history, it may be recommended that you also have a yearly breast X-ray (mammogram).  If you have a family history of breast cancer, talk with your health care provider about genetic screening.  If you are at high risk for breast cancer, talk with your health care provider about having an MRI and a mammogram every year.  Breast cancer (BRCA) gene test is recommended for women who have family members with BRCA-related cancers. Results of the assessment will determine the need for genetic counseling and BRCA1 and for BRCA2 testing. BRCA-related cancers include these types:  Breast. This occurs in males or females.  Ovarian.  Tubal. This may also be called fallopian tube  cancer.  Cancer of the abdominal or pelvic lining (peritoneal cancer).  Prostate.  Pancreatic. Cervical, Uterine, and Ovarian Cancer Your health care provider may recommend that you be screened regularly for cancer of the pelvic organs. These include your ovaries, uterus, and vagina. This screening involves a pelvic exam, which includes checking for microscopic changes to the surface of your cervix (Pap test).  For women ages 21-65, health care providers may recommend a pelvic exam and a Pap test every three years. For women ages 65-65, they may recommend the Pap test and pelvic exam, combined with testing for human papilloma virus (HPV), every five years. Some types of HPV increase your risk of cervical cancer. Testing for HPV may also be done on women of any age who have unclear Pap test results.  Other health care providers may not recommend any screening for nonpregnant women who are considered low risk for pelvic cancer and have no symptoms. Ask your health care provider if a screening pelvic exam is right for you.  If you have had past  treatment for cervical cancer or a condition that could lead to cancer, you need Pap tests and screening for cancer for at least 20 years after your treatment. If Pap tests have been discontinued for you, your risk factors (such as having a new sexual partner) need to be reassessed to determine if you should start having screenings again. Some women have medical problems that increase the chance of getting cervical cancer. In these cases, your health care provider may recommend that you have screening and Pap tests more often.  If you have a family history of uterine cancer or ovarian cancer, talk with your health care provider about genetic screening.  If you have vaginal bleeding after reaching menopause, tell your health care provider.  There are currently no reliable tests available to screen for ovarian cancer. Lung Cancer Lung cancer screening is recommended for adults 61-41 years old who are at high risk for lung cancer because of a history of smoking. A yearly low-dose CT scan of the lungs is recommended if you:  Currently smoke.  Have a history of at least 30 pack-years of smoking and you currently smoke or have quit within the past 15 years. A pack-year is smoking an average of one pack of cigarettes per day for one year. Yearly screening should:  Continue until it has been 15 years since you quit.  Stop if you develop a health problem that would prevent you from having lung cancer treatment. Colorectal Cancer  This type of cancer can be detected and can often be prevented.  Routine colorectal cancer screening usually begins at age 75 and continues through age 62.  If you have risk factors for colon cancer, your health care provider may recommend that you be screened at an earlier age.  If you have a family history of colorectal cancer, talk with your health care provider about genetic screening.  Your health care provider may also recommend using home test kits to check for  hidden blood in your stool.  A small camera at the end of a tube can be used to examine your colon directly (sigmoidoscopy or colonoscopy). This is done to check for the earliest forms of colorectal cancer.  Direct examination of the colon should be repeated every 5-10 years until age 2. However, if early forms of precancerous polyps or small growths are found or if you have a family history or genetic risk for colorectal cancer, you may  need to be screened more often. Skin Cancer  Check your skin from head to toe regularly.  Monitor any moles. Be sure to tell your health care provider:  About any new moles or changes in moles, especially if there is a change in a mole's shape or color.  If you have a mole that is larger than the size of a pencil eraser.  If any of your family members has a history of skin cancer, especially at a young age, talk with your health care provider about genetic screening.  Always use sunscreen. Apply sunscreen liberally and repeatedly throughout the day.  Whenever you are outside, protect yourself by wearing long sleeves, pants, a wide-brimmed hat, and sunglasses. WHAT SHOULD I KNOW ABOUT OSTEOPOROSIS? Osteoporosis is a condition in which bone destruction happens more quickly than new bone creation. After menopause, you may be at an increased risk for osteoporosis. To help prevent osteoporosis or the bone fractures that can happen because of osteoporosis, the following is recommended:  If you are 11-67 years old, get at least 1,000 mg of calcium and at least 600 mg of vitamin D per day.  If you are older than age 78 but younger than age 79, get at least 1,200 mg of calcium and at least 600 mg of vitamin D per day.  If you are older than age 40, get at least 1,200 mg of calcium and at least 800 mg of vitamin D per day. Smoking and excessive alcohol intake increase the risk of osteoporosis. Eat foods that are rich in calcium and vitamin D, and do weight-bearing  exercises several times each week as directed by your health care provider. WHAT SHOULD I KNOW ABOUT HOW MENOPAUSE AFFECTS [place]? Depression may occur at any age, but it is more common as you become older. Common symptoms of depression include:  Low or sad mood.  Changes in sleep patterns.  Changes in appetite or eating patterns.  Feeling an overall lack of motivation or enjoyment of activities that you previously enjoyed.  Frequent crying spells. Talk with your health care provider if you think that you are experiencing depression. WHAT SHOULD I KNOW ABOUT IMMUNIZATIONS? It is important that you get and maintain your immunizations. These include:  Tetanus, diphtheria, and pertussis (Tdap) booster vaccine.  Influenza every year before the flu season begins.  Pneumonia vaccine.  Shingles vaccine. Your health care provider may also recommend other immunizations.   This information is not intended to replace advice given to you by your health care provider. Make sure you discuss any questions you have with your health care provider.   Document Released: 10/24/2005 Document Revised: 09/22/2014 Document Reviewed: 05/04/2014 Elsevier Interactive Patient Education [employer].

## 2015-09-27 NOTE — Progress Notes (Signed)
Patient ID: Bethany Fernandez, female    DOB: Jan 26, 1966  Age: 50 y.o. MRN: [id number]  The patient is here for annual wellness examination and management of other chronic and acute problems.   The risk factors are reflected in the social history.  The roster of all physicians providing medical care to patient - is listed in the Snapshot section of the chart.  Activities of daily living:  The patient is 100% independent in all ADLs: dressing, toileting, feeding as well as independent mobility  Home safety : The patient has smoke detectors in the home. They wear seatbelts.  There are no firearms at home. There is no violence in the home.   There is no risks for hepatitis, STDs or HIV. There is no   history of blood transfusion. They have no travel history to infectious disease endemic areas of the world.  The patient has seen their dentist in the last six month. They have seen their eye doctor in the last year. They admit to slight hearing difficulty with regard to whispered voices and some television programs.  They have deferred audiologic testing in the last year.  They do not  have excessive sun exposure. Discussed the need for sun protection: hats, long sleeves and use of sunscreen if there is significant sun exposure.   Diet: the importance of a healthy diet is discussed. They do have a healthy diet.  The benefits of regular aerobic exercise were discussed. She walks 4 times per week ,  20 minutes.   Depression screen: there are no signs or vegative symptoms of depression- irritability, change in appetite, anhedonia, sadness/tearfullness.  Cognitive assessment: the patient manages all their financial and personal affairs and is actively engaged. They could relate day,date,year and events; recalled 2/3 objects at 3 minutes; performed clock-face test normally.  The following portions of the patient's history were reviewed and updated as appropriate: allergies, current medications, past family  history, past medical history,  past surgical history, past social history  and problem list.  Visual acuity was not assessed per patient preference since she has regular follow up with her ophthalmologist. Hearing and body mass index were assessed and reviewed.   During the course of the visit the patient was educated and counseled about appropriate screening and preventive services including : fall prevention , diabetes screening, nutrition counseling, colorectal cancer screening, and recommended immunizations.    CC: The primary encounter diagnosis was Encounter for preventive health examination. Diagnoses of Essential hypertension, benign, Weight gain, abnormal, and Obesity were also pertinent to this visit.  History Bethany Fernandez has a past medical history of Fibrocystic breast (2013); Hypertension (1999); Chicken pox; Hyperlipidemia; and Migraines.   She has past surgical history that includes Partial hysterectomy (2012); Breast biopsy (2013); Abdominal hysterectomy; and Ectopic pregnancy surgery.   Her family history includes Alcohol abuse in her maternal grandfather; Breast cancer (age of onset: 68) in her cousin; Diabetes in her father; Heart disease (age of onset: 70) in her maternal aunt; Hyperlipidemia in her father and mother; Hypertension in her father and mother; Kidney disease in her maternal uncle; Lung disease (age of onset: 4) in her maternal grandfather; Stroke in her paternal grandfather.She reports that she has never smoked. She has never used smokeless tobacco. She reports that she drinks alcohol. She reports that she does not use illicit drugs.  Outpatient Prescriptions Prior to Visit  Medication Sig Dispense Refill  . atorvastatin (LIPITOR) 20 MG tablet TAKE 1 TABLET (20 MG TOTAL) BY MOUTH  DAILY. 90 tablet 3  . calcium-vitamin D (OSCAL WITH D) 250-125 MG-UNIT per tablet Take 1 tablet by mouth daily.    . cyanocobalamin 1000 MCG tablet Take 100 mcg by mouth daily.    . furosemide  (LASIX) 20 MG tablet TAKE 1 TABLET BY MOUTH EVERY DAY AS NEEDED FOR FLUID 90 tablet 4  . Multiple Vitamin (MULTI VITAMIN DAILY PO) Take by mouth.    Marland Kitchen. amLODipine (NORVASC) 5 MG tablet TAKE 1 TABLET (5 MG TOTAL) BY MOUTH DAILY. 90 tablet 4   No facility-administered medications prior to visit.    Review of Systems   Patient denies headache, fevers, malaise, unintentional weight loss, skin rash, eye pain, sinus congestion and sinus pain, sore throat, dysphagia,  hemoptysis , cough, dyspnea, wheezing, chest pain, palpitations, orthopnea, edema, abdominal pain, nausea, melena, diarrhea, constipation, flank pain, dysuria, hematuria, urinary  Frequency, nocturia, numbness, tingling, seizures,  Focal weakness, Loss of consciousness,  Tremor, insomnia, depression, anxiety, and suicidal ideation.      Objective:  BP 122/80 mmHg  Pulse 74  Temp(Src) 98.9 F (37.2 C) (Oral)  Ht 5' 2.25" (1.581 m)  Wt 167 lb (75.751 kg)  BMI 30.31 kg/m2  SpO2 98%  Physical Exam   General appearance: alert, cooperative and appears stated age Head: Normocephalic, without obvious abnormality, atraumatic Eyes: conjunctivae/corneas clear. PERRL, EOM's intact. Fundi benign. Ears: normal TM's and external ear canals both ears Nose: Nares normal. Septum midline. Mucosa normal. No drainage or sinus tenderness. Throat: lips, mucosa, and tongue normal; teeth and gums normal Neck: no adenopathy, no carotid bruit, no JVD, supple, symmetrical, trachea midline and thyroid not enlarged, symmetric, no tenderness/mass/nodules Lungs: clear to auscultation bilaterally Breasts: normal appearance, no masses or tenderness Heart: regular rate and rhythm, S1, S2 normal, no murmur, click, rub or gallop Abdomen: soft, non-tender; bowel sounds normal; no masses,  no organomegaly Extremities: extremities normal, atraumatic, no cyanosis or edema Pulses: 2+ and symmetric Skin: Skin color, texture, turgor normal. No rashes or  lesions Neurologic: Alert and oriented X 3, normal strength and tone. Normal symmetric reflexes. Normal coordination and gait.     Assessment & Plan:   Problem List Items Addressed This Visit    Essential hypertension, benign    Well controlled on current regimen. Renal function stable, no changes today.  . Lab Results  Component Value Date   CREATININE 0.85 09/26/2015   Lab Results  Component Value Date   NA 143 09/26/2015   K 4.4 09/26/2015   CL 103 09/26/2015   CO2 36* 09/26/2015         Relevant Medications   amLODipine (NORVASC) 5 MG tablet   Weight gain, abnormal    Secondary to lack of exercise and excess caloric intake.       Encounter for preventive health examination - Primary    Annual comprehensive preventive exam was done as well as an evaluation and management of chronic conditions .  During the course of the visit the patient was educated and counseled about appropriate screening and preventive services including :  diabetes screening, lipid analysis with projected  10 year  risk for CAD  using the Framingham risk calculator for women, , nutrition counseling, colorectal cancer screening, and recommended immunizations.  Printed recommendations for health maintenance screenings was given.   Lab Results  Component Value Date   CHOL 169 03/20/2015   HDL 53.70 03/20/2015   LDLCALC 104* 03/20/2015   LDLDIRECT 162.3 04/12/2013   TRIG 59.0 03/20/2015  CHOLHDL 3 03/20/2015         Obesity    I have addressed  BMI and recommended a low glycemic index diet utilizing smaller more frequent meals to increase metabolism.  I have also recommended that patient start exercising with a goal of 30 minutes of aerobic exercise a minimum of 5 days per week. Screening for lipid disorders, thyroid and diabetes to be done today.           I am having Bethany Fernandez maintain her cyanocobalamin, calcium-vitamin D, Multiple Vitamin (MULTI VITAMIN DAILY PO), furosemide,  atorvastatin, and amLODipine.  Meds ordered this encounter  Medications  . amLODipine (NORVASC) 5 MG tablet    Sig: TAKE 1 TABLET (5 MG TOTAL) BY MOUTH DAILY.    Dispense:  90 tablet    Refill:  4    Medications Discontinued During This Encounter  Medication Reason  . amLODipine (NORVASC) 5 MG tablet Reorder    Follow-up: No Follow-up on file.   Sherlene Shams, MD

## 2015-09-29 NOTE — Assessment & Plan Note (Signed)
Annual comprehensive preventive exam was done as well as an evaluation and management of chronic conditions .  During the course of the visit the patient was educated and counseled about appropriate screening and preventive services including :  diabetes screening, lipid analysis with projected  10 year  risk for CAD  using the Framingham risk calculator for women, , nutrition counseling, colorectal cancer screening, and recommended immunizations.  Printed recommendations for health maintenance screenings was given.   Lab Results  Component Value Date   CHOL 169 03/20/2015   HDL 53.70 03/20/2015   LDLCALC 104* 03/20/2015   LDLDIRECT 162.3 04/12/2013   TRIG 59.0 03/20/2015   CHOLHDL 3 03/20/2015

## 2015-09-29 NOTE — Assessment & Plan Note (Signed)
Secondary to lack of exercise and excess caloric intake.

## 2015-09-29 NOTE — Assessment & Plan Note (Signed)
I have addressed  BMI and recommended a low glycemic index diet utilizing smaller more frequent meals to increase metabolism.  I have also recommended that patient start exercising with a goal of 30 minutes of aerobic exercise a minimum of 5 days per week. Screening for lipid disorders, thyroid and diabetes to be done today.   

## 2015-09-29 NOTE — Assessment & Plan Note (Signed)
Well controlled on current regimen. Renal function stable, no changes today.  . Lab Results  Component Value Date   CREATININE 0.85 09/26/2015   Lab Results  Component Value Date   NA 143 09/26/2015   K 4.4 09/26/2015   CL 103 09/26/2015   CO2 36* 09/26/2015

## 2016-01-29 ENCOUNTER — Encounter: Payer: Self-pay | Admitting: Family Medicine

## 2016-01-29 ENCOUNTER — Ambulatory Visit (INDEPENDENT_AMBULATORY_CARE_PROVIDER_SITE_OTHER): Payer: BLUE CROSS/BLUE SHIELD | Admitting: Family Medicine

## 2016-01-29 VITALS — BP 126/82 | HR 83 | Temp 98.8°F | Ht 62.25 in | Wt 175.0 lb

## 2016-01-29 DIAGNOSIS — R2240 Localized swelling, mass and lump, unspecified lower limb: Secondary | ICD-10-CM | POA: Insufficient documentation

## 2016-01-29 DIAGNOSIS — R2241 Localized swelling, mass and lump, right lower limb: Secondary | ICD-10-CM

## 2016-01-29 NOTE — Progress Notes (Signed)
   Subjective:  Patient ID: Bethany Fernandez, female    DOB: 03/15/1966  Age: 50 y.o. MRN: [id number]  CC: Lump, R lower leg  HPI:  50 year old female presents with the above complaint.   Lump, R Lower leg  Noticed while putting on lotion 4 days ago.  No recent trauma/fall/injury.  Nontender.  No surrounding redness.  No drainage from the area.  Feels that it is about the size of a quarter.  No SOB, fever, chills or other symptoms.  No other complaints at this time.   Social Hx   Social History   Social History  . Marital Status: Married    Spouse Name: N/A  . Number of Children: N/A  . Years of Education: N/A   Social History Main Topics  . Smoking status: Never Smoker   . Smokeless tobacco: Never Used  . Alcohol Use: 0.0 oz/week    0 Standard drinks or equivalent per week     Comment: occ  . Drug Use: No  . Sexual Activity: Not Asked   Other Topics Concern  . None   Social History Narrative   Review of Systems  Constitutional: Negative.   Respiratory: Negative.   Skin:       Lump, right lower leg.   Objective:  BP 126/82 mmHg  Pulse 83  Temp(Src) 98.8 F (37.1 C) (Oral)  Ht 5' 2.25" (1.581 m)  Wt 175 lb (79.379 kg)  BMI 31.76 kg/m2  SpO2 96%  BP/Weight 01/29/2016 09/27/2015 04/25/2015  Systolic BP 126 122 124  Diastolic BP 82 80 80  Wt. (Lbs) 175 167 166  BMI 31.76 30.31 29.41   Physical Exam  Constitutional: She is oriented to person, place, and time. She appears well-developed. No distress.  Pulmonary/Chest: Effort normal.  Neurological: She is alert and oriented to person, place, and time.  Skin:  R lower leg - Palpable nodule noted laterally. No surrounding erythema. No drainage. Nontender to palpation.  Psychiatric: She has a normal mood and affect.  Vitals reviewed.  Lab Results  Component Value Date   GLUCOSE 102* 09/26/2015   CHOL 169 03/20/2015   TRIG 59.0 03/20/2015   HDL 53.70 03/20/2015   LDLDIRECT 162.3 04/12/2013   LDLCALC 104* 03/20/2015   ALT 23 09/26/2015   AST 20 09/26/2015   NA 143 09/26/2015   K 4.4 09/26/2015   CL 103 09/26/2015   CREATININE 0.85 09/26/2015   BUN 9 09/26/2015   CO2 36* 09/26/2015   TSH 0.51 04/12/2013   MICROALBUR 2.5* 04/12/2013   Assessment & Plan:   Problem List Items Addressed This Visit    Mass of lower leg - Primary    New problem with uncertain prognosis. Suspect secondary to injury (although she is not aware of a recent imaging). No evidence of DVT. Not consistent with abscess. Patient concerned. Will obtain US for evaluation. Reassurance provided.      Relevant Orders   US Misc Soft Tissue     Follow-up: PRN  Everlene OtherJayce Tandrea Kommer DO [facility] [place]

## 2016-01-29 NOTE — Assessment & Plan Note (Addendum)
New problem with uncertain prognosis. Suspect secondary to injury (although she is not aware of a recent imaging). No evidence of DVT. Not consistent with abscess. Patient concerned. Will obtain US for evaluation. Reassurance provided.

## 2016-01-29 NOTE — Patient Instructions (Signed)
We will call with your appt.  Call if you worsen  Take care  Dr. Adriana Simasook

## 2016-01-30 NOTE — Addendum Note (Signed)
Addended by: Jennelle HumanNEWTON, Allea Kassner E on: 01/30/2016 04:24 PM   Modules accepted: Orders

## 2016-02-01 ENCOUNTER — Ambulatory Visit
Admission: RE | Admit: 2016-02-01 | Discharge: 2016-02-01 | Disposition: A | Discharge status: Home / Self Care | Payer: BLUE CROSS/BLUE SHIELD | Source: Ambulatory Visit | Attending: Family Medicine | Admitting: Family Medicine

## 2016-02-01 DIAGNOSIS — R2241 Localized swelling, mass and lump, right lower limb: Secondary | ICD-10-CM | POA: Insufficient documentation

## 2016-02-01 DIAGNOSIS — R600 Localized edema: Secondary | ICD-10-CM | POA: Diagnosis not present

## 2016-03-24 ENCOUNTER — Encounter: Payer: Self-pay | Admitting: General Surgery

## 2016-03-24 DIAGNOSIS — Z1231 Encounter for screening mammogram for malignant neoplasm of breast: Secondary | ICD-10-CM | POA: Diagnosis not present

## 2016-03-25 ENCOUNTER — Encounter: Payer: Self-pay | Admitting: *Deleted

## 2016-04-01 ENCOUNTER — Ambulatory Visit (INDEPENDENT_AMBULATORY_CARE_PROVIDER_SITE_OTHER): Payer: BLUE CROSS/BLUE SHIELD | Admitting: General Surgery

## 2016-04-01 ENCOUNTER — Encounter: Payer: Self-pay | Admitting: General Surgery

## 2016-04-01 VITALS — BP 132/80 | HR 80 | Resp 12 | Ht 62.5 in | Wt 172.0 lb

## 2016-04-01 DIAGNOSIS — Z87898 Personal history of other specified conditions: Secondary | ICD-10-CM

## 2016-04-01 NOTE — Progress Notes (Signed)
Patient ID: Bethany Fernandez, female   DOB: 03-Jul-1966, 50 y.o.   MRN: [id number]  Chief Complaint  Patient presents with  . Follow-up    mammogram    HPI Bethany Fernandez is a 50 y.o. female who presents for a breast evaluation. The most recent mammogram was done on 03/24/16. Patient does perform regular self breast checks and gets regular mammograms done. Patient states she noticed a drop of discharge last week from her breast. Has not seen any since. No other breast changes. I have reviewed the history of present illness with the patient.   HPI  Past Medical History  Diagnosis Date  . Fibrocystic breast 2013  . Hypertension 1999  . Chicken pox   . Hyperlipidemia   . Migraines     Past Surgical History  Procedure Laterality Date  . Partial hysterectomy  2012  . Breast biopsy  2013    right  . Abdominal hysterectomy    . Ectopic pregnancy surgery      Family History  Problem Relation Age of Onset  . Breast cancer Cousin 50  . Lung disease Maternal Grandfather 42  . Alcohol abuse Maternal Grandfather   . Hyperlipidemia Mother   . Hypertension Mother   . Hyperlipidemia Father   . Hypertension Father   . Diabetes Father   . Stroke Paternal Grandfather   . Kidney disease Maternal Uncle   . Heart disease Maternal Aunt 67    massive AMI, died    Social History Social History  Substance Use Topics  . Smoking status: Never Smoker   . Smokeless tobacco: Never Used  . Alcohol Use: 0.0 oz/week    0 Standard drinks or equivalent per week     Comment: occ    Allergies  Allergen Reactions  . Tape Rash    Current Outpatient Prescriptions  Medication Sig Dispense Refill  . amLODipine (NORVASC) 5 MG tablet TAKE 1 TABLET (5 MG TOTAL) BY MOUTH DAILY. 90 tablet 4  . atorvastatin (LIPITOR) 20 MG tablet TAKE 1 TABLET (20 MG TOTAL) BY MOUTH DAILY. 90 tablet 3  . calcium-vitamin D (OSCAL WITH D) 250-125 MG-UNIT per tablet Take 1 tablet by mouth daily.    . cyanocobalamin 1000 MCG  tablet Take 100 mcg by mouth daily.    . furosemide (LASIX) 20 MG tablet TAKE 1 TABLET BY MOUTH EVERY DAY AS NEEDED FOR FLUID 90 tablet 4  . Multiple Vitamin (MULTI VITAMIN DAILY PO) Take by mouth.     No current facility-administered medications for this visit.    Review of Systems Review of Systems  Constitutional: Negative.   Respiratory: Negative.   Cardiovascular: Negative.     Blood pressure 132/80, pulse 80, resp. rate 12, height 5' 2.5" (1.588 m), weight 172 lb (78.019 kg).  Physical Exam Physical Exam  Constitutional: She is oriented to person, place, and time. She appears well-developed and well-nourished.  Eyes: Conjunctivae are normal. No scleral icterus.  Neck: Neck supple.  Cardiovascular: Normal rate and normal heart sounds.   Pulmonary/Chest: Effort normal and breath sounds normal. Right breast exhibits no inverted nipple, no mass, no nipple discharge, no skin change and no tenderness. Left breast exhibits no inverted nipple, no mass, no nipple discharge, no skin change and no tenderness.  Abdominal: Soft. Bowel sounds are normal.  Lymphadenopathy:    She has no cervical adenopathy.    She has no axillary adenopathy.  Neurological: She is alert and oriented to person, place, and time.  Skin: Skin is warm and dry.    Data Reviewed Mammogram reviewed, stable  Assessment    History of fibrocystic disease of breast. Remote FH of breast cancer.  No new changes on mammogram. Physical exam within normal limits.    Plan    Follow up in one year with bilateral screening mammogram and office visit      PCP:  Bethany Fernandez, Bethany Fernandez    Bethany Fernandez 04/02/2016, 12:21 PM

## 2016-04-01 NOTE — Patient Instructions (Signed)
Follow up in one year with Bilateral screening mammogram and office visit

## 2016-04-02 ENCOUNTER — Encounter: Payer: Self-pay | Admitting: General Surgery

## 2016-08-15 ENCOUNTER — Other Ambulatory Visit: Payer: Self-pay | Admitting: Internal Medicine

## 2016-08-24 ENCOUNTER — Other Ambulatory Visit: Payer: Self-pay | Admitting: Internal Medicine

## 2016-08-25 NOTE — Telephone Encounter (Signed)
No, lipid since 7/16 last OV 1/17 ok to fill?

## 2016-09-16 ENCOUNTER — Telehealth: Payer: Self-pay | Admitting: Internal Medicine

## 2016-09-16 DIAGNOSIS — E538 Deficiency of other specified B group vitamins: Secondary | ICD-10-CM

## 2016-09-16 DIAGNOSIS — Z79899 Other long term (current) drug therapy: Secondary | ICD-10-CM

## 2016-09-16 DIAGNOSIS — E559 Vitamin D deficiency, unspecified: Secondary | ICD-10-CM

## 2016-09-16 DIAGNOSIS — E7800 Pure hypercholesterolemia, unspecified: Secondary | ICD-10-CM

## 2016-09-16 MED ORDER — AMLODIPINE BESYLATE 5 MG PO TABS: ORAL_TABLET | ORAL | 0 refills | Status: DC

## 2016-09-16 NOTE — Telephone Encounter (Signed)
I refilled  the amlodipine,  but NOT the atorvastatin bc she has not had liver enzymes checked . Fasting labs ordered.

## 2016-09-16 NOTE — Telephone Encounter (Signed)
Pt called requesting a refill on her amLODipine (NORVASC) 5 MG tablet and atorvastatin (LIPITOR) 20 MG tablet. Pt is scheduled for 09/29/16 @ 4:30.0 Please advise, thank you!  Call pt @ [phone]  Pharmacy - [phone] - [place], [place] - [address]

## 2016-09-16 NOTE — Telephone Encounter (Signed)
Patient voiced understanding as to scheduling fasting labs.

## 2016-09-16 NOTE — Telephone Encounter (Signed)
Patient has scheduled appointment can we fill for 30 days? Last lab 09/27/15

## 2016-09-18 ENCOUNTER — Other Ambulatory Visit: Payer: BLUE CROSS/BLUE SHIELD

## 2016-09-25 ENCOUNTER — Other Ambulatory Visit: Payer: BLUE CROSS/BLUE SHIELD

## 2016-09-26 ENCOUNTER — Other Ambulatory Visit (INDEPENDENT_AMBULATORY_CARE_PROVIDER_SITE_OTHER): Payer: BLUE CROSS/BLUE SHIELD

## 2016-09-26 DIAGNOSIS — Z79899 Other long term (current) drug therapy: Secondary | ICD-10-CM

## 2016-09-26 DIAGNOSIS — E538 Deficiency of other specified B group vitamins: Secondary | ICD-10-CM

## 2016-09-26 DIAGNOSIS — E7800 Pure hypercholesterolemia, unspecified: Secondary | ICD-10-CM

## 2016-09-26 DIAGNOSIS — E559 Vitamin D deficiency, unspecified: Secondary | ICD-10-CM

## 2016-09-26 LAB — LIPID PANEL
CHOL/HDL RATIO: 4
Cholesterol: 228 mg/dL — ABNORMAL HIGH (ref 0–200)
HDL: 61.4 mg/dL (ref >39.00)
LDL CALC: 155 mg/dL — AB (ref 0–99)
NONHDL: 166.39
TRIGLYCERIDES: 57 mg/dL (ref 0.0–149.0)
VLDL: 11.4 mg/dL (ref 0.0–40.0)

## 2016-09-26 LAB — COMPREHENSIVE METABOLIC PANEL
ALT: 26 U/L (ref 0–35)
AST: 19 U/L (ref 0–37)
Albumin: 4.1 g/dL (ref 3.5–5.2)
Alkaline Phosphatase: 74 U/L (ref 39–117)
BUN: 12 mg/dL (ref 6–23)
CALCIUM: 9.5 mg/dL (ref 8.4–10.5)
CHLORIDE: 105 meq/L (ref 96–112)
CO2: 33 meq/L — AB (ref 19–32)
CREATININE: 0.92 mg/dL (ref 0.40–1.20)
GFR: 83.52 mL/min (ref >60.00)
GLUCOSE: 102 mg/dL — AB (ref 70–99)
Potassium: 4.8 mEq/L (ref 3.5–5.1)
SODIUM: 144 meq/L (ref 135–145)
Total Bilirubin: 0.6 mg/dL (ref 0.2–1.2)
Total Protein: 6.9 g/dL (ref 6.0–8.3)

## 2016-09-26 LAB — VITAMIN B12: Vitamin B-12: 550 pg/mL (ref 211–911)

## 2016-09-26 LAB — VITAMIN D 25 HYDROXY (VIT D DEFICIENCY, FRACTURES): VITD: 27.68 ng/mL — ABNORMAL LOW (ref 30.00–100.00)

## 2016-09-28 ENCOUNTER — Other Ambulatory Visit: Payer: Self-pay | Admitting: Internal Medicine

## 2016-09-28 MED ORDER — ERGOCALCIFEROL 1.25 MG (50000 UT) PO CAPS: 50000.0000 [IU] | ORAL_CAPSULE | ORAL | 0 refills | Status: DC

## 2016-09-28 NOTE — Progress Notes (Signed)
Drisdol

## 2016-09-29 ENCOUNTER — Ambulatory Visit (INDEPENDENT_AMBULATORY_CARE_PROVIDER_SITE_OTHER): Payer: BLUE CROSS/BLUE SHIELD | Admitting: Internal Medicine

## 2016-09-29 ENCOUNTER — Encounter: Payer: Self-pay | Admitting: Internal Medicine

## 2016-09-29 ENCOUNTER — Other Ambulatory Visit (HOSPITAL_COMMUNITY)
Admission: RE | Admit: 2016-09-29 | Discharge: 2016-09-29 | Disposition: A | Discharge status: Home / Self Care | Payer: BLUE CROSS/BLUE SHIELD | Source: Ambulatory Visit | Attending: Internal Medicine | Admitting: Internal Medicine

## 2016-09-29 VITALS — BP 118/88 | HR 80 | Temp 98.5°F | Resp 16 | Ht 63.0 in | Wt 172.0 lb

## 2016-09-29 DIAGNOSIS — Z0000 Encounter for general adult medical examination without abnormal findings: Secondary | ICD-10-CM

## 2016-09-29 DIAGNOSIS — R300 Dysuria: Secondary | ICD-10-CM

## 2016-09-29 DIAGNOSIS — Z6830 Body mass index (BMI) 30.0-30.9, adult: Secondary | ICD-10-CM

## 2016-09-29 DIAGNOSIS — N760 Acute vaginitis: Secondary | ICD-10-CM | POA: Diagnosis not present

## 2016-09-29 DIAGNOSIS — Z1151 Encounter for screening for human papillomavirus (HPV): Secondary | ICD-10-CM | POA: Diagnosis not present

## 2016-09-29 DIAGNOSIS — E784 Other hyperlipidemia: Secondary | ICD-10-CM

## 2016-09-29 DIAGNOSIS — H00011 Hordeolum externum right upper eyelid: Secondary | ICD-10-CM | POA: Diagnosis not present

## 2016-09-29 DIAGNOSIS — Z124 Encounter for screening for malignant neoplasm of cervix: Secondary | ICD-10-CM | POA: Diagnosis not present

## 2016-09-29 DIAGNOSIS — Z113 Encounter for screening for infections with a predominantly sexual mode of transmission: Secondary | ICD-10-CM | POA: Insufficient documentation

## 2016-09-29 DIAGNOSIS — E7849 Other hyperlipidemia: Secondary | ICD-10-CM

## 2016-09-29 DIAGNOSIS — E6609 Other obesity due to excess calories: Secondary | ICD-10-CM

## 2016-09-29 DIAGNOSIS — Z01419 Encounter for gynecological examination (general) (routine) without abnormal findings: Secondary | ICD-10-CM | POA: Diagnosis not present

## 2016-09-29 DIAGNOSIS — I10 Essential (primary) hypertension: Secondary | ICD-10-CM

## 2016-09-29 LAB — POCT URINALYSIS DIPSTICK
BILIRUBIN UA: NEGATIVE
GLUCOSE UA: NEGATIVE
Ketones, UA: 15
NITRITE UA: NEGATIVE
PH UA: 5
Protein, UA: 100
Spec Grav, UA: 1.025
UROBILINOGEN UA: 0.2

## 2016-09-29 MED ORDER — CIPROFLOXACIN HCL 250 MG PO TABS: 250.0000 mg | ORAL_TABLET | Freq: Two times a day (BID) | ORAL | 0 refills | Status: DC

## 2016-09-29 NOTE — Progress Notes (Signed)
Pre visit review using our clinic review tool, if applicable. No additional management support is needed unless otherwise documented below in the visit note. 

## 2016-09-29 NOTE — Progress Notes (Signed)
Patient ID: ARANTXA PIERCEY, female    DOB: Jul 12, 1966  Age: 51 y.o. MRN: [id number]  The patient is here for annual Medicare wellness examination and management of other chronic and acute problems. Last seen Jan 2017   The risk factors are reflected in the social history.  Declined flu vaccine    PAP normal august 2014 Mammogram normal July 2017 [facility]  Has been Menopausal since age 28. night sweats   The roster of all physicians providing medical care to patient - is listed in the Snapshot section of the chart.  Activities of daily living:  The patient is 100% independent in all ADLs: dressing, toileting, feeding as well as independent mobility  Home safety : The patient has smoke detectors in the home. They wear seatbelts.  There are no firearms at home. There is no violence in the home.   There is no risks for hepatitis, STDs or HIV. There is no   history of blood transfusion. They have no travel history to infectious disease endemic areas of the world.  The patient has seen their dentist in the last six month. They have seen their eye doctor in the last year. They admit to slight hearing difficulty with regard to whispered voices and some television programs.  They have deferred audiologic testing in the last year.  They do not  have excessive sun exposure. Discussed the need for sun protection: hats, long sleeves and use of sunscreen if there is significant sun exposure.   Diet: the importance of a healthy diet is discussed. They do not have a healthy diet.  The benefits of regular aerobic exercise were discussed. She is not exercising regularly and has not lost any weight    Depression screen: there are no signs or vegative symptoms of depression- irritability, change in appetite, anhedonia, sadness/tearfullness.    The following portions of the patient's history were reviewed and updated as appropriate: allergies, current medications, past family history, past medical history,  past  surgical history, past social history  and problem list.  Visual acuity was not assessed per patient preference since she has regular follow up with her ophthalmologist. Hearing and body mass index were assessed and reviewed.   During the course of the visit the patient was educated and counseled about appropriate screening and preventive services including : fall prevention , diabetes screening, nutrition counseling, colorectal cancer screening, and recommended immunizations.    CC: The primary encounter diagnosis was Dysuria. Diagnoses of Vaginitis and vulvovaginitis, Cervical cancer screening, Hordeolum externum of right upper eyelid, Class 1 obesity due to excess calories without serious comorbidity with body mass index (BMI) of 30.0 to 30.9 in adult, Essential hypertension, benign, Encounter for preventive health examination, and Familial hyperlipidemia, high LDL were also pertinent to this visit.  1) Recurrent/persistent urinary symotoms  Self treated last week  But symptoms have persisted. Started after having intercourse with husband after a long lapse in activity .  2) has developed a style on her  left upper eyelid , present for several days after having contact with 49 yr old who also had stye   History Neena has a past medical history of Chicken pox; Fibrocystic breast (2013); Hyperlipidemia; Hypertension (1999); and Migraines.   She has a past surgical history that includes Partial hysterectomy (2012); Breast biopsy (2013); Abdominal hysterectomy; and Ectopic pregnancy surgery.   Her family history includes Alcohol abuse in her maternal grandfather; Breast cancer (age of onset: 37) in her cousin; Diabetes in her father;  Heart disease (age of onset: 6457) in her maternal aunt; Hyperlipidemia in her father and mother; Hypertension in her father and mother; Kidney disease in her maternal uncle; Lung disease (age of onset: 2269) in her maternal grandfather; Stroke in her paternal grandfather.She  reports that she has never smoked. She has never used smokeless tobacco. She reports that she drinks alcohol. She reports that she does not use drugs.  Outpatient Medications Prior to Visit  Medication Sig Dispense Refill  . amLODipine (NORVASC) 5 MG tablet TAKE 1 TABLET (5 MG TOTAL) BY MOUTH DAILY. 30 tablet 0  . calcium-vitamin D (OSCAL WITH D) 250-125 MG-UNIT per tablet Take 1 tablet by mouth daily.    . cyanocobalamin 1000 MCG tablet Take 100 mcg by mouth daily.    . ergocalciferol (DRISDOL) 50000 units capsule Take 1 capsule (50,000 Units total) by mouth once a week. 4 capsule 0  . furosemide (LASIX) 20 MG tablet TAKE 1 TABLET BY MOUTH EVERY DAY AS NEEDED FOR FLUID 90 tablet 1  . Multiple Vitamin (MULTI VITAMIN DAILY PO) Take by mouth.    Marland Kitchen. atorvastatin (LIPITOR) 20 MG tablet TAKE 1 TABLET (20 MG TOTAL) BY MOUTH DAILY. 90 tablet 3   No facility-administered medications prior to visit.     Review of Systems   Patient denies headache, fevers, malaise, unintentional weight loss, skin rash,, sinus congestion and sinus pain, sore throat, dysphagia,  hemoptysis , cough, dyspnea, wheezing, chest pain, palpitations, orthopnea, edema, abdominal pain, nausea, melena, diarrhea, constipation, flank pain,  hematuria, urinary  Frequency, nocturia, numbness, tingling, seizures,  Focal weakness, Loss of consciousness,  Tremor, insomnia, depression, anxiety, and suicidal ideation.      Objective:  BP 118/88 (BP Location: Left Arm, Patient Position: Sitting, Cuff Size: Normal)   Pulse 80   Temp 98.5 F (36.9 C) (Oral)   Resp 16   Ht 5\' 3"  (1.6 m)   Wt 172 lb (78 kg)   SpO2 97%   BMI 30.47 kg/m   Physical Exam   General Appearance:    Alert, cooperative, no distress, appears stated age  Head:    Normocephalic, without obvious abnormality, atraumatic  Eyes:    PERRL, conjunctiva/corneas clear, EOM's intact, left upper eyelid with hordoleum, without surrounding erythema or tenderness    Ears:     Normal TM's and external ear canals, both ears  Nose:   Nares normal, septum midline, mucosa normal, no drainage    or sinus tenderness  Throat:   Lips, mucosa, and tongue normal; teeth and gums normal  Neck:   Supple, symmetrical, trachea midline, no adenopathy;    thyroid:  no enlargement/tenderness/nodules; no carotid   bruit or JVD  Back:     Symmetric, no curvature, ROM normal, no CVA tenderness  Lungs:     Clear to auscultation bilaterally, respirations unlabored  Chest Wall:    No tenderness or deformity   Heart:    Regular rate and rhythm, S1 and S2 normal, no murmur, rub   or gallop  Breast Exam:    No tenderness, masses, or nipple abnormality  Abdomen:     Soft, non-tender, bowel sounds active all four quadrants,    no masses, no organomegaly  Genitalia:    Pelvic:  external genitalia normal, no adnexal masses or tenderness,  rectovaginal septum normal, uterus surgically  absent, cervix visualized and nontender ,  and vagina atrophic  without discharge or erythema   Extremities:   Extremities normal, atraumatic, no cyanosis or edema  Pulses:   2+ and symmetric all extremities  Skin:   Skin color, texture, turgor normal, no rashes or lesions  Lymph nodes:   Cervical, supraclavicular, and axillary nodes normal  Neurologic:   CNII-XII intact, normal strength, sensation and reflexes    throughout      Assessment & Plan:   Problem List Items Addressed This Visit    Cervical cancer screening   Relevant Orders   Cytology - PAP   Dysuria - Primary    UTI suspected by patient and self treated with augmentin but symptoms persist.  Dipstick and formal UA suggest inflammation without infection.  Empiric cipro pending  culture data.  If symptoms resolve, no further workup. If symptoms do not,  Suggest trial of premarin cream vs workup for interstitial cystitis       Relevant Orders   Urinalysis, Routine w reflex microscopic (Completed)   Urine culture   POCT Urinalysis Dipstick  (Completed)   Encounter for preventive health examination    Annual comprehensive preventive exam was done as well as an evaluation and management of chronic conditions .  During the course of the visit the patient was educated and counseled about appropriate screening and preventive services including :  diabetes screening, lipid analysis with projected  10 year  risk for CAD , nutrition counseling, breast, cervical cancer and std screening, and recommended immunizations.  Printed recommendations for health maintenance screenings was given      Essential hypertension, benign    Well controlled on current regimen. Renal function normal.  No changes to regimen.   Lab Results  Component Value Date   CREATININE 0.92 09/26/2016   Lab Results  Component Value Date   NA 144 09/26/2016   K 4.8 09/26/2016   CL 105 09/26/2016   CO2 33 (H) 09/26/2016        Familial hyperlipidemia, high LDL    Previously managed with lipitor which she has not taken in 3 months due to being lost to follow up.  Advised to resume medication.  Lab Results  Component Value Date   CHOL 228 (H) 09/26/2016   HDL 61.40 09/26/2016   LDLCALC 155 (H) 09/26/2016   LDLDIRECT 162.3 04/12/2013   TRIG 57.0 09/26/2016   CHOLHDL 4 09/26/2016   Lab Results  Component Value Date   ALT 26 09/26/2016   AST 19 09/26/2016   ALKPHOS 74 09/26/2016   BILITOT 0.6 09/26/2016         Hordeolum externum of right upper eyelid    Discussed the natural history and treatment of styes, including their viral etiology .  Advised to use warm compresses  4 to 6 times daily.  Call if the area becomes hardened or if signs of preseptal cellulitis appear.      Obesity    I have addressed  BMI and recommended wt loss of 10% of body weight over the next 6 months using a low fat, low starch, high protein  fruit/vegetable based Mediterranean diet and 30 minutes of aerobic exercise a minimum of 5 days per week.        Vaginitis and  vulvovaginitis    Checking for std's during PAP /pelvic given persistent symptoms       Relevant Orders   Cytology - PAP      I am having Ms. Belongia start on ciprofloxacin. I am also having her maintain her cyanocobalamin, calcium-vitamin D, Multiple Vitamin (MULTI VITAMIN DAILY PO), furosemide, amLODipine, and ergocalciferol.  Meds ordered this  encounter  Medications  . ciprofloxacin (CIPRO) 250 MG tablet    Sig: Take 1 tablet (250 mg total) by mouth 2 (two) times daily.    Dispense:  6 tablet    Refill:  0    There are no discontinued medications.  Follow-up: No Follow-up on file.   Sherlene Shams, MD

## 2016-09-29 NOTE — Patient Instructions (Addendum)
Increase your vitamin d to 200 ius daily  Resume lipitor!   cipro 250 mg twice daily for 3 days,  If culture is negative,  Consider premarin cream for atrophic vaginitis  .

## 2016-09-30 ENCOUNTER — Other Ambulatory Visit: Payer: Self-pay | Admitting: Internal Medicine

## 2016-09-30 LAB — URINALYSIS, ROUTINE W REFLEX MICROSCOPIC
BILIRUBIN URINE: NEGATIVE
Nitrite: NEGATIVE
PH: 5.5 (ref 5.0–8.0)
SPECIFIC GRAVITY, URINE: 1.025 (ref 1.000–1.030)
TOTAL PROTEIN, URINE-UPE24: 30 — AB
UROBILINOGEN UA: 0.2 (ref 0.0–1.0)
Urine Glucose: NEGATIVE

## 2016-10-01 ENCOUNTER — Encounter: Payer: Self-pay | Admitting: Internal Medicine

## 2016-10-01 DIAGNOSIS — Z124 Encounter for screening for malignant neoplasm of cervix: Secondary | ICD-10-CM | POA: Insufficient documentation

## 2016-10-01 DIAGNOSIS — H00011 Hordeolum externum right upper eyelid: Secondary | ICD-10-CM | POA: Insufficient documentation

## 2016-10-01 DIAGNOSIS — N760 Acute vaginitis: Secondary | ICD-10-CM | POA: Insufficient documentation

## 2016-10-01 DIAGNOSIS — R300 Dysuria: Secondary | ICD-10-CM | POA: Insufficient documentation

## 2016-10-01 NOTE — Assessment & Plan Note (Addendum)
UTI suspected by patient and self treated with augmentin but symptoms persist.  Dipstick and formal UA suggest inflammation without infection.  Empiric cipro pending  culture data.  If symptoms resolve, no further workup. If symptoms do not,  Suggest trial of premarin cream vs workup for interstitial cystitis

## 2016-10-01 NOTE — Assessment & Plan Note (Signed)
Well controlled on current regimen. Renal function normal.  No changes to regimen.   Lab Results  Component Value Date   CREATININE 0.92 09/26/2016   Lab Results  Component Value Date   NA 144 09/26/2016   K 4.8 09/26/2016   CL 105 09/26/2016   CO2 33 (H) 09/26/2016

## 2016-10-01 NOTE — Assessment & Plan Note (Addendum)
Annual comprehensive preventive exam was done as well as an evaluation and management of chronic conditions .  During the course of the visit the patient was educated and counseled about appropriate screening and preventive services including :  diabetes screening, lipid analysis with projected  10 year  risk for CAD , nutrition counseling, breast, cervical cancer and std screening, and recommended immunizations.  Printed recommendations for health maintenance screenings was given

## 2016-10-01 NOTE — Assessment & Plan Note (Signed)
Checking for std's during PAP /pelvic given persistent symptoms

## 2016-10-01 NOTE — Assessment & Plan Note (Signed)
Previously managed with lipitor which she has not taken in 3 months due to being lost to follow up.  Advised to resume medication.  Lab Results  Component Value Date   CHOL 228 (H) 09/26/2016   HDL 61.40 09/26/2016   LDLCALC 155 (H) 09/26/2016   LDLDIRECT 162.3 04/12/2013   TRIG 57.0 09/26/2016   CHOLHDL 4 09/26/2016   Lab Results  Component Value Date   ALT 26 09/26/2016   AST 19 09/26/2016   ALKPHOS 74 09/26/2016   BILITOT 0.6 09/26/2016

## 2016-10-01 NOTE — Assessment & Plan Note (Signed)
I have addressed  BMI and recommended wt loss of 10% of body weight over the next 6 months using a low fat, low starch, high protein  fruit/vegetable based Mediterranean diet and 30 minutes of aerobic exercise a minimum of 5 days per week.   

## 2016-10-01 NOTE — Assessment & Plan Note (Signed)
Discussed the natural history and treatment of styes, including their viral etiology .  Advised to use warm compresses  4 to 6 times daily.  Call if the area becomes hardened or if signs of preseptal cellulitis appear.

## 2016-10-02 LAB — CYTOLOGY - PAP
Adequacy: ABSENT
Bacterial vaginitis: NEGATIVE
CANDIDA VAGINITIS: NEGATIVE
CHLAMYDIA, DNA PROBE: NEGATIVE
Diagnosis: NEGATIVE
HPV: NOT DETECTED
Neisseria Gonorrhea: NEGATIVE
TRICH (WINDOWPATH): NEGATIVE

## 2016-10-02 LAB — URINE CULTURE: Organism ID, Bacteria: NO GROWTH

## 2016-10-03 ENCOUNTER — Encounter: Payer: Self-pay | Admitting: Internal Medicine

## 2016-10-09 ENCOUNTER — Encounter: Payer: Self-pay | Admitting: Internal Medicine

## 2016-10-09 NOTE — Telephone Encounter (Signed)
I have no way to  resend her labs from January. .Please  drop a copy in the mail. Regards,   Duncan Dulleresa Dominic Rhome, MD

## 2016-10-16 NOTE — Telephone Encounter (Signed)
Labs sent to patient

## 2016-10-18 ENCOUNTER — Other Ambulatory Visit: Payer: Self-pay | Admitting: Internal Medicine

## 2016-10-24 ENCOUNTER — Other Ambulatory Visit: Payer: Self-pay

## 2016-10-24 MED ORDER — ERGOCALCIFEROL 1.25 MG (50000 UT) PO CAPS: 50000.0000 [IU] | ORAL_CAPSULE | ORAL | 1 refills | Status: DC

## 2016-11-19 ENCOUNTER — Other Ambulatory Visit: Payer: Self-pay | Admitting: Internal Medicine

## 2016-12-29 ENCOUNTER — Ambulatory Visit (INDEPENDENT_AMBULATORY_CARE_PROVIDER_SITE_OTHER): Payer: BLUE CROSS/BLUE SHIELD | Admitting: Internal Medicine

## 2016-12-29 ENCOUNTER — Encounter: Payer: Self-pay | Admitting: Internal Medicine

## 2016-12-29 VITALS — BP 128/82 | HR 73 | Temp 98.1°F | Resp 16 | Ht 63.0 in | Wt 175.6 lb

## 2016-12-29 DIAGNOSIS — H8103 Meniere's disease, bilateral: Secondary | ICD-10-CM

## 2016-12-29 DIAGNOSIS — I10 Essential (primary) hypertension: Secondary | ICD-10-CM | POA: Diagnosis not present

## 2016-12-29 DIAGNOSIS — Z6832 Body mass index (BMI) 32.0-32.9, adult: Secondary | ICD-10-CM

## 2016-12-29 DIAGNOSIS — R0609 Other forms of dyspnea: Secondary | ICD-10-CM

## 2016-12-29 DIAGNOSIS — E6609 Other obesity due to excess calories: Secondary | ICD-10-CM | POA: Diagnosis not present

## 2016-12-29 DIAGNOSIS — E7849 Other hyperlipidemia: Secondary | ICD-10-CM

## 2016-12-29 DIAGNOSIS — E039 Hypothyroidism, unspecified: Secondary | ICD-10-CM

## 2016-12-29 DIAGNOSIS — R635 Abnormal weight gain: Secondary | ICD-10-CM

## 2016-12-29 DIAGNOSIS — E784 Other hyperlipidemia: Secondary | ICD-10-CM | POA: Diagnosis not present

## 2016-12-29 MED ORDER — LEVOTHYROXINE SODIUM 25 MCG PO TABS: 25.0000 ug | ORAL_TABLET | Freq: Every day | ORAL | 0 refills | Status: DC

## 2016-12-29 NOTE — Patient Instructions (Addendum)
WE ARE STARTING 25 MCG SYNTHROID (LEVOTHYROXINE) DAILY in the morning  .  Take at least 30 mintues prior to coffee and other medicaitons  Return in 6 weeks for repeat thyroid level   Referring you to cardiology for evaluation of heart rhythm    Hypothyroidism Hypothyroidism is a disorder of the thyroid. The thyroid is a large gland that is located in the lower front of the neck. The thyroid releases hormones that control how the body works. With hypothyroidism, the thyroid does not make enough of these hormones. What are the causes? Causes of hypothyroidism may include:  Viral infections.  Pregnancy.  Your own defense system (immune system) attacking your thyroid.  Certain medicines.  Birth defects.  Past radiation treatments to your head or neck.  Past treatment with radioactive iodine.  Past surgical removal of part or all of your thyroid.  Problems with the gland that is located in the center of your brain (pituitary). What are the signs or symptoms? Signs and symptoms of hypothyroidism may include:  Feeling as though you have no energy (lethargy).  Inability to tolerate cold.  Weight gain that is not explained by a change in diet or exercise habits.  Dry skin.  Coarse hair.  Menstrual irregularity.  Slowing of thought processes.  Constipation.  Sadness or depression. How is this diagnosed? Your health care provider may diagnose hypothyroidism with blood tests and ultrasound tests. How is this treated? Hypothyroidism is treated with medicine that replaces the hormones that your body does not make. After you begin treatment, it may take several weeks for symptoms to go away. Follow these instructions at home:  Take medicines only as directed by your health care provider.  If you start taking any new medicines, tell your health care provider.  Keep all follow-up visits as directed by your health care provider. This is important. As your condition improves,  your dosage needs may change. You will need to have blood tests regularly so that your health care provider can watch your condition. Contact a health care provider if:  Your symptoms do not get better with treatment.  You are taking thyroid replacement medicine and:  You sweat excessively.  You have tremors.  You feel anxious.  You lose weight rapidly.  You cannot tolerate heat.  You have emotional swings.  You have diarrhea.  You feel weak. Get help right away if:  You develop chest pain.  You develop an irregular heartbeat.  You develop a rapid heartbeat. This information is not intended to replace advice given to you by your health care provider. Make sure you discuss any questions you have with your health care provider. Document Released: 09/01/2005 Document Revised: 02/07/2016 Document Reviewed: 01/17/2014 Elsevier Interactive Patient Education  2017 [employer].

## 2016-12-29 NOTE — Progress Notes (Signed)
Subjective:  Patient ID: Bethany Fernandez, female    DOB: 1966/03/22  Age: 51 y.o. MRN: [id number]  CC: The primary encounter diagnosis was Hypothyroidism (acquired). Diagnoses of Labyrinthine vertigo, bilateral, Exertional dyspnea, Hyperlipidemia, familial, high LDL, Familial hyperlipidemia, high LDL, Class 1 obesity due to excess calories with body mass index (BMI) of 32.0 to 32.9 in adult, unspecified whether serious comorbidity present, Weight gain, abnormal, and Essential hypertension, benign were also pertinent to this visit.  HPI [name] presents for follow up on hypertension, hyperlipidemia and obesity  Treated for superficial thrombophlebitis of RLE in May 2017 b Bethany Fernandez  Had an episode of vertigo with N/V  that occurred  after exercising on the elliptical recently.  The episode lasted  5-6 minutes,  Did not occur the following day .  Has a history of vertigo occurring regularly during episodes of extreme exertion,  when  she was in the [facility] when she first started exercising after a protracted period of sedentary lifestyle.  She has never had a cardiac workup . No known history of syncope.  No FH of sudden death or early CAD. Personal history of hyperlipidemia and  hypertension      Outpatient Medications Prior to Visit  Medication Sig Dispense Refill  . amLODipine (NORVASC) 5 MG tablet TAKE 1 TABLET (5 MG TOTAL) BY MOUTH DAILY. 30 tablet 3  . atorvastatin (LIPITOR) 20 MG tablet TAKE 1 TABLET (20 MG TOTAL) BY MOUTH DAILY. 90 tablet 3  . calcium-vitamin D (OSCAL WITH D) 250-125 MG-UNIT per tablet Take 1 tablet by mouth daily.    . ergocalciferol (DRISDOL) 50000 units capsule Take 1 capsule (50,000 Units total) by mouth once a week. 8 capsule 1  . furosemide (LASIX) 20 MG tablet TAKE 1 TABLET BY MOUTH EVERY DAY AS NEEDED FOR FLUID 90 tablet 1  . Multiple Vitamin (MULTI VITAMIN DAILY PO) Take by mouth.    . ciprofloxacin (CIPRO) 250 MG tablet Take 1 tablet (250 mg total) by mouth  2 (two) times daily. (Patient not taking: Reported on 12/29/2016) 6 tablet 0  . cyanocobalamin 1000 MCG tablet Take 100 mcg by mouth daily.     No facility-administered medications prior to visit.     Review of Systems;  Patient denies headache, fevers, malaise, unintentional weight loss, skin rash, eye pain, sinus congestion and sinus pain, sore throat, dysphagia,  hemoptysis , cough, dyspnea, wheezing, chest pain, palpitations, orthopnea, edema, abdominal pain, nausea, melena, diarrhea, constipation, flank pain, dysuria, hematuria, urinary  Frequency, nocturia, numbness, tingling, seizures,  Focal weakness, Loss of consciousness,  Tremor, insomnia, depression, anxiety, and suicidal ideation.      Objective:  BP 128/82   Pulse 73   Temp 98.1 F (36.7 C) (Oral)   Resp 16   Ht  (1.6 m)   Wt 175 lb 9.6 oz (79.7 kg)   SpO2 96%   BMI 31.11 kg/m   BP Readings from Last 3 Encounters:  12/29/16 128/82  09/29/16 118/88  04/01/16 132/80    Wt Readings from Last 3 Encounters:  12/29/16 175 lb 9.6 oz (79.7 kg)  09/29/16 172 lb (78 kg)  04/01/16 172 lb (78 kg)    General appearance: alert, cooperative and appears stated age Ears: normal TM's and external ear canals both ears Throat: lips, mucosa, and tongue normal; teeth and gums normal Neck: no adenopathy, no carotid bruit, supple, symmetrical, trachea midline and thyroid not enlarged, symmetric, no tenderness/mass/nodules Back: symmetric, no curvature. ROM normal. No  CVA tenderness. Lungs: clear to auscultation bilaterally Heart: regular rate and rhythm, S1, S2 normal, no murmur, click, rub or gallop Abdomen: soft, non-tender; bowel sounds normal; no masses,  no organomegaly Pulses: 2+ and symmetric Skin: Skin color, texture, turgor normal. No rashes or lesions Lymph nodes: Cervical, supraclavicular, and axillary nodes normal.  No results found for: HGBA1C  Lab Results  Component Value Date   CREATININE 0.92 09/26/2016     CREATININE 0.85 09/26/2015   CREATININE 1.20 03/20/2015    Lab Results  Component Value Date   GLUCOSE 102 (H) 09/26/2016   CHOL 228 (H) 09/26/2016   TRIG 57.0 09/26/2016   HDL 61.40 09/26/2016   LDLDIRECT 162.3 04/12/2013   LDLCALC 155 (H) 09/26/2016   ALT 26 09/26/2016   AST 19 09/26/2016   NA 144 09/26/2016   K 4.8 09/26/2016   CL 105 09/26/2016   CREATININE 0.92 09/26/2016   BUN 12 09/26/2016   CO2 33 (H) 09/26/2016   TSH 0.51 04/12/2013   MICROALBUR 2.5 (H) 04/12/2013    No results found.  Assessment & Plan:   Problem List Items Addressed This Visit    Essential hypertension, benign    Controlled with amlodipine.  No changes today       Exertional dyspnea    accompanied by vertigo.  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 Referral to cardiology to rule out arrhythmia with stress testing.       Relevant Orders   Comprehensive metabolic panel   Familial hyperlipidemia, high LDL    Previously managed with lipitor which she resumed in January . Repeat LDL and hepatic panel due.  Lab Results  Component Value Date   CHOL 228 (H) 09/26/2016   HDL 61.40 09/26/2016   LDLCALC 155 (H) 09/26/2016   LDLDIRECT 162.3 04/12/2013   TRIG 57.0 09/26/2016   CHOLHDL 4 09/26/2016   Lab Results  Component Value Date   ALT 26 09/26/2016   AST 19 09/26/2016   ALKPHOS 74 09/26/2016   BILITOT 0.6 09/26/2016         Obesity    I have addressed  BMI and recommended wt loss of 10% of body weight over the next 6 months using a low fat, low starch, high protein  fruit/vegetable based Mediterranean diet and 30 minutes of aerobic exercise a minimum of 5 days per week.        Weight gain, abnormal    Recent TSH was elevated during [facility] screening and she has been unable to lose weight with exercise and diet.  Starting 25 mcg levothyroxine.  rtc 6 weeks        Other Visit Diagnoses    Hypothyroidism (acquired)    -   Primary   Relevant Medications   levothyroxine (SYNTHROID, LEVOTHROID) 25 MCG tablet   Other Relevant Orders   T4 AND TSH   Thyroglobulin Antibody Panel   Labyrinthine vertigo, bilateral       Relevant Orders   Ambulatory referral to Cardiology   Hyperlipidemia, familial, high LDL       Relevant Orders   LDL cholesterol, direct      I have discontinued Ms. Saville's cyanocobalamin and ciprofloxacin. I am also having her start on levothyroxine. Additionally, I am having her maintain her calcium-vitamin D, Multiple Vitamin (MULTI VITAMIN DAILY PO), furosemide, atorvastatin, ergocalciferol, and amLODipine.  Meds ordered this encounter  Medications  . levothyroxine (SYNTHROID, LEVOTHROID) 25 MCG tablet  Sig: Take 1 tablet (25 mcg total) by mouth daily before breakfast.    Dispense:  90 tablet    Refill:  0    Medications Discontinued During This Encounter  Medication Reason  . ciprofloxacin (CIPRO) 250 MG tablet Therapy completed  . cyanocobalamin 1000 MCG tablet Patient has not taken in last 30 days    Follow-up: No Follow-up on file.   Sherlene Shams, MD

## 2016-12-29 NOTE — Progress Notes (Signed)
Pre visit review using our clinic review tool, if applicable. No additional management support is needed unless otherwise documented below in the visit note. 

## 2016-12-30 ENCOUNTER — Encounter: Payer: Self-pay | Admitting: Internal Medicine

## 2016-12-30 DIAGNOSIS — R0609 Other forms of dyspnea: Secondary | ICD-10-CM | POA: Insufficient documentation

## 2016-12-30 NOTE — Assessment & Plan Note (Signed)
I have addressed  BMI and recommended wt loss of 10% of body weight over the next 6 months using a low fat, low starch, high protein  fruit/vegetable based Mediterranean diet and 30 minutes of aerobic exercise a minimum of 5 days per week.   

## 2016-12-30 NOTE — Assessment & Plan Note (Signed)
Previously managed with lipitor which she resumed in January . Repeat LDL and hepatic panel due.  Lab Results  Component Value Date   CHOL 228 (H) 09/26/2016   HDL 61.40 09/26/2016   LDLCALC 155 (H) 09/26/2016   LDLDIRECT 162.3 04/12/2013   TRIG 57.0 09/26/2016   CHOLHDL 4 09/26/2016   Lab Results  Component Value Date   ALT 26 09/26/2016   AST 19 09/26/2016   ALKPHOS 74 09/26/2016   BILITOT 0.6 09/26/2016

## 2016-12-30 NOTE — Assessment & Plan Note (Signed)
Recent TSH was elevated during [facility] screening and she has been unable to lose weight with exercise and diet.  Starting 25 mcg levothyroxine.  rtc 6 weeks

## 2016-12-30 NOTE — Assessment & Plan Note (Signed)
Controlled with amlodipine.  No changes today

## 2016-12-30 NOTE — Assessment & Plan Note (Signed)
accompanied by vertigo.  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 Referral to cardiology to rule out arrhythmia with stress testing.

## 2017-01-14 ENCOUNTER — Ambulatory Visit (INDEPENDENT_AMBULATORY_CARE_PROVIDER_SITE_OTHER): Payer: BLUE CROSS/BLUE SHIELD | Admitting: Cardiology

## 2017-01-14 ENCOUNTER — Encounter: Payer: Self-pay | Admitting: Cardiology

## 2017-01-14 VITALS — BP 108/80 | HR 77 | Ht 63.0 in | Wt 172.0 lb

## 2017-01-14 DIAGNOSIS — R55 Syncope and collapse: Secondary | ICD-10-CM

## 2017-01-14 DIAGNOSIS — R0609 Other forms of dyspnea: Secondary | ICD-10-CM | POA: Diagnosis not present

## 2017-01-14 DIAGNOSIS — E784 Other hyperlipidemia: Secondary | ICD-10-CM | POA: Diagnosis not present

## 2017-01-14 DIAGNOSIS — I10 Essential (primary) hypertension: Secondary | ICD-10-CM | POA: Diagnosis not present

## 2017-01-14 DIAGNOSIS — R42 Dizziness and giddiness: Secondary | ICD-10-CM | POA: Diagnosis not present

## 2017-01-14 DIAGNOSIS — E7849 Other hyperlipidemia: Secondary | ICD-10-CM

## 2017-01-14 NOTE — Patient Instructions (Addendum)
Testing/Procedures: Your physician has requested that you have an echocardiogram. Echocardiography is a painless test that uses sound waves to create images of your heart. It provides your doctor with information about the size and shape of your heart and how well your heart's chambers and valves are working. This procedure takes approximately one hour. There are no restrictions for this procedure.  Your physician has requested that you have a stress echocardiogram. For further information please visit [email or web address]. Please follow instruction sheet as given.   Do not drink or eat foods with caffeine for 24 hours before the test. (Chocolate, coffee, tea, or energy drinks)  If you use an inhaler, bring it with you to the test.  Do not smoke for 4 hours before the test.  Wear comfortable shoes and clothing.  Follow-Up: Your physician recommends that you schedule a follow-up appointment as needed. We will call you with results and if needed schedule follow up at that time.   It was a pleasure seeing you today here in the office. Please do not hesitate to give us a call back if you have any further questions. [phone]  Jemina Scahill A. RN, BSN    Echocardiogram An echocardiogram, or echocardiography, uses sound waves (ultrasound) to produce an image of your heart. The echocardiogram is simple, painless, obtained within a short period of time, and offers valuable information to your health care provider. The images from an echocardiogram can provide information such as:  Evidence of coronary artery disease (CAD).  Heart size.  Heart muscle function.  Heart valve function.  Aneurysm detection.  Evidence of a past heart attack.  Fluid buildup around the heart.  Heart muscle thickening.  Assess heart valve function. Tell a health care provider about:  Any allergies you have.  All medicines you are taking, including vitamins, herbs, eye drops, creams, and over-the-counter  medicines.  Any problems you or family members have had with anesthetic medicines.  Any blood disorders you have.  Any surgeries you have had.  Any medical conditions you have.  Whether you are pregnant or may be pregnant. What happens before the procedure? No special preparation is needed. Eat and drink normally. What happens during the procedure?  In order to produce an image of your heart, gel will be applied to your chest and a wand-like tool (transducer) will be moved over your chest. The gel will help transmit the sound waves from the transducer. The sound waves will harmlessly bounce off your heart to allow the heart images to be captured in real-time motion. These images will then be recorded.  You may need an IV to receive a medicine that improves the quality of the pictures. What happens after the procedure? You may return to your normal schedule including diet, activities, and medicines, unless your health care provider tells you otherwise. This information is not intended to replace advice given to you by your health care provider. Make sure you discuss any questions you have with your health care provider. Document Released: 08/29/2000 Document Revised: 04/19/2016 Document Reviewed: 05/09/2013 Elsevier Interactive Patient Education  2017 Else[employer] Inc[employer]  Exercise Stress Echocardiogram An exercise stress echocardiogram is a test that checks how well your heart is working. For this test, you will walk on a treadmill to make your heart beat faster. This test uses sound waves (ultrasound) and a computer to make pictures (images) of your heart. These pictures will be taken before you exercise and after you exercise. What happens before the procedure?  Follow instructions   from your doctor about what you cannot eat or drink before the test.  Do not drink or eat anything that has caffeine in it. Stop having caffeine for 24 hours before the test.  Ask your doctor about changing or  stopping your normal medicines. This is important if you take diabetes medicines or blood thinners. Ask your doctor if you should take your medicines with water before the test.  If you use an inhaler, bring it to the test.  Do not use any products that have nicotine or tobacco in them, such as cigarettes and e-cigarettes. Stop using them for 4 hours before the test. If you need help quitting, ask your doctor.  Wear comfortable shoes and clothing. What happens during the procedure?  You will be hooked up to a TV screen. Your doctor will watch the screen to see how fast your heart beats during the test.  Before you exercise, a computer will make a picture of your heart. To do this:  A gel will be put on your chest.  A wand will be moved over the gel.  Sound waves from the wand will go to the computer to make the picture.  Your will start walking on a treadmill. The treadmill will start at a slow speed. It will get faster a little bit at a time. When you walk faster, your heart will beat faster.  The treadmill will be stopped when your heart is working hard.  You will lie down right away so another picture of your heart can be taken.  The test will take 30-60 minutes. What happens after the procedure?  Your heart rate and blood pressure will be watched after the test.  If your doctor says that you can, you may:  Eat what you usually eat.  Do your normal activities.  Take medicines like normal. Summary  An exercise stress echocardiogram is a test that checks how well your heart is working.  Follow instructions about what you cannot eat or drink before the test. Ask your doctor if you should take your normal medicines before the test.  Stop having caffeine for 24 hours before the test. Do not use anything with nicotine or tobacco in it for 4 hours before the test.  A computer will take a picture of your heart before you walk on a treadmill. It will take another picture when  you are done walking.  Your heart rate and blood pressure will be watched after the test. This information is not intended to replace advice given to you by your health care provider. Make sure you discuss any questions you have with your health care provider. Document Released: 06/29/2009 Document Revised: 05/25/2016 Document Reviewed: 05/25/2016 Elsevier Interactive Patient Education  2017 Elsevier Inc.  

## 2017-01-14 NOTE — Progress Notes (Signed)
Cardiology Office Note   Date:  01/14/2017   ID:  Bethany Fernandez, DOB 1966/04/25, MRN [id number]  Referring Doctor:  Sherlene Shams, MD   Cardiologist:   Almond Lint, MD   Reason for consultation:  Chief Complaint  Patient presents with  . other    Ref by Dr. Darrick Huntsman for exertional dyspnea. Meds reviewed by the pt. verbally. Pt. c/o after getting off the elyptical, became very dizzy.       History of Present Illness: Bethany Fernandez is a 51 y.o. female who is being seen today for the evaluation of Lightheadedness and dyspnea on exertion at the request of Sherlene Shams, MD.  Patient reports that whenever she goes back to regular exercise after prolonged period of inactivity, she would have an episode of dizziness or lightheadedness with feeling of almost passing out, together with shortness of breath or hyperventilation during the activity. She would also throw up. This has happened for many years now (even when she was in [employer]) and she has grown accustomed to it. After that first episode, the next day or the next time she exercises, this would not happen again and that is why she has never really been bothered by it. She has attributed it to not regularly exercising. PCP refers to cardiology since patient has never had this worked up before.  With regard to the Lasix, she uses that for ankle swelling and takes it every day.  No other episodes with regular walking or doing chores. No PND, orthopnea, edema. No palpitations. No true syncope.  ROS:  Please see the history of present illness. Aside from mentioned under HPI, all other systems are reviewed and negative.     Past Medical History:  Diagnosis Date  . Chicken pox   . Fibrocystic breast 2013  . Hyperlipidemia   . Hypertension 1999  . Migraines     Past Surgical History:  Procedure Laterality Date  . ABDOMINAL HYSTERECTOMY    . BREAST BIOPSY  2013   right  . ECTOPIC PREGNANCY SURGERY    . PARTIAL  HYSTERECTOMY  2012     reports that she has never smoked. She has never used smokeless tobacco. She reports that she drinks alcohol. She reports that she does not use drugs.   family history includes Alcohol abuse in her maternal grandfather; Breast cancer (age of onset: 45) in her cousin; Diabetes in her father; Heart disease (age of onset: 42) in her maternal aunt; Hyperlipidemia in her father and mother; Hypertension in her father and mother; Kidney disease in her maternal uncle; Lung disease (age of onset: 67) in her maternal grandfather; Stroke in her paternal grandfather.   Outpatient Medications Prior to Visit  Medication Sig Dispense Refill  . amLODipine (NORVASC) 5 MG tablet TAKE 1 TABLET (5 MG TOTAL) BY MOUTH DAILY. 30 tablet 3  . atorvastatin (LIPITOR) 20 MG tablet TAKE 1 TABLET (20 MG TOTAL) BY MOUTH DAILY. 90 tablet 3  . calcium-vitamin D (OSCAL WITH D) 250-125 MG-UNIT per tablet Take 1 tablet by mouth daily.    . ergocalciferol (DRISDOL) 50000 units capsule Take 1 capsule (50,000 Units total) by mouth once a week. 8 capsule 1  . furosemide (LASIX) 20 MG tablet TAKE 1 TABLET BY MOUTH EVERY DAY AS NEEDED FOR FLUID 90 tablet 1  . levothyroxine (SYNTHROID, LEVOTHROID) 25 MCG tablet Take 1 tablet (25 mcg total) by mouth daily before breakfast. 90 tablet 0  . Multiple Vitamin (MULTI  VITAMIN DAILY PO) Take by mouth.     No facility-administered medications prior to visit.      Allergies: Tape    PHYSICAL EXAM: VS:  BP 108/80 (BP Location: Left Arm, Patient Position: Sitting, Cuff Size: Normal)   Pulse 77   Ht  (1.6 m)   Wt 172 lb (78 kg)   BMI 30.47 kg/m  , Body mass index is 30.47 kg/m. Wt Readings from Last 3 Encounters:  01/14/17 172 lb (78 kg)  12/29/16 175 lb 9.6 oz (79.7 kg)  09/29/16 172 lb (78 kg)    GENERAL:  well developed, well nourished, not in acute distress HEENT: normocephalic, pink conjunctivae, anicteric sclerae, no xanthelasma, normal dentition,  oropharynx clear NECK:  no neck vein engorgement, JVP normal, no hepatojugular reflux, carotid upstroke brisk and symmetric, no bruit, no thyromegaly, no lymphadenopathy LUNGS:  good respiratory effort, clear to auscultation bilaterally CV:  PMI not displaced, no thrills, no lifts, S1 and S2 within normal limits, no palpable S3 or S4, no murmurs, no rubs, no gallops ABD:  Soft, nontender, nondistended, normoactive bowel sounds, no abdominal aortic bruit, no hepatomegaly, no splenomegaly MS: nontender back, no kyphosis, no scoliosis, no joint deformities EXT:  2+ DP/PT pulses, no edema, no varicosities, no cyanosis, no clubbing SKIN: warm, nondiaphoretic, normal turgor, no ulcers NEUROPSYCH: alert, oriented to person, place, and time, sensory/motor grossly intact, normal mood, appropriate affect  Recent Labs: 09/26/2016: ALT 26; BUN 12; Creatinine, Ser 0.92; Potassium 4.8; Sodium 144   Lipid Panel    Component Value Date/Time   CHOL 228 (H) 09/26/2016 0813   TRIG 57.0 09/26/2016 0813   HDL 61.40 09/26/2016 0813   CHOLHDL 4 09/26/2016 0813   VLDL 11.4 09/26/2016 0813   LDLCALC 155 (H) 09/26/2016 0813   LDLDIRECT 162.3 04/12/2013 0839     Other studies Reviewed:  EKG:  The ekg from 01/14/2017 was personally reviewed by me and it revealed sinus rhythm, 77 BPM  Additional studies/ records that were reviewed personally reviewed by me today include: None available   ASSESSMENT AND PLAN: Dyspnea on exertion Lightheadedness or dizziness/presyncope Likely this is multifactorial: Element of deconditioning, likely contribution of orthostatic change in blood pressure, volume depletion from diuretic, and not being well hydrated. Patient admits to drinking only 4 glasses of water a day. Recommend to discontinue Lasix. Her swelling is confined mostly to the ankle joint. She does not report any pitting edema, and none on physical exam. Recommend to increase fluid intake to keep herself well  hydrated. Recommend to slowly build up on exercise instead of exercising heavily after prolonged period of not exercising or inactivity. Recommend to do a stress echocardiogram to evaluate for arrhythmia during exercise, evaluate blood pressure changes with exercise, wall motion changes. Recommend echocardiogram as well.  Hypertension Blood pressure today is normal but relatively on the lower side. As discussed above, we will recommend to discontinue furosemide. Continue blood pressure monitoring.  Hyperlipidemia PCP following labs. Patient on statin therapy. Recommend dietary lifestyle changes as well.   Current medicines are reviewed at length with the patient today.  The patient does not have concerns regarding medicines.  Labs/ tests ordered today include:  Orders Placed This Encounter  Procedures  . EKG 12-Lead  . ECHOCARDIOGRAM COMPLETE  . ECHOCARDIOGRAM STRESS TEST    I had a lengthy and detailed discussion with the patient regarding diagnoses, prognosis, diagnostic options, treatment options , and side effects of medications.   I counseled the patient on  importance of lifestyle modification including heart healthy diet, regular physical activity once cardiac work up is completed.    Disposition:   FU with Cardiology after tests  Thank you for this consultation. We will forwarding this consultation to referring physician.   I spent at least 60 minutes with the patient today and more than 50% of the time was spent counseling the patient and coordinating care.   Signed, Almond Lint, MD  01/14/2017 11:30 AM    [facility]  This note was generated in part with voice recognition software and I apologize for any typographical errors that were not detected and corrected.

## 2017-02-10 ENCOUNTER — Other Ambulatory Visit: Payer: BLUE CROSS/BLUE SHIELD

## 2017-02-11 ENCOUNTER — Other Ambulatory Visit (INDEPENDENT_AMBULATORY_CARE_PROVIDER_SITE_OTHER): Payer: BLUE CROSS/BLUE SHIELD

## 2017-02-11 DIAGNOSIS — E7849 Other hyperlipidemia: Secondary | ICD-10-CM

## 2017-02-11 DIAGNOSIS — E039 Hypothyroidism, unspecified: Secondary | ICD-10-CM

## 2017-02-11 DIAGNOSIS — E784 Other hyperlipidemia: Secondary | ICD-10-CM

## 2017-02-11 DIAGNOSIS — R0609 Other forms of dyspnea: Secondary | ICD-10-CM | POA: Diagnosis not present

## 2017-02-11 LAB — COMPREHENSIVE METABOLIC PANEL
ALBUMIN: 4.2 g/dL (ref 3.5–5.2)
ALK PHOS: 77 U/L (ref 39–117)
ALT: 30 U/L (ref 0–35)
AST: 23 U/L (ref 0–37)
BUN: 9 mg/dL (ref 6–23)
CO2: 31 mEq/L (ref 19–32)
CREATININE: 0.94 mg/dL (ref 0.40–1.20)
Calcium: 9.3 mg/dL (ref 8.4–10.5)
Chloride: 106 mEq/L (ref 96–112)
GFR: 81.35 mL/min (ref >60.00)
Glucose, Bld: 105 mg/dL — ABNORMAL HIGH (ref 70–99)
Potassium: 3.8 mEq/L (ref 3.5–5.1)
SODIUM: 141 meq/L (ref 135–145)
TOTAL PROTEIN: 7.4 g/dL (ref 6.0–8.3)
Total Bilirubin: 0.5 mg/dL (ref 0.2–1.2)

## 2017-02-11 LAB — LDL CHOLESTEROL, DIRECT: Direct LDL: 103 mg/dL

## 2017-02-12 LAB — THYROGLOBULIN ANTIBODY PANEL
Thyroglobulin Ab: <1 IU/mL (ref <2)
Thyroglobulin: 21.1 ng/mL

## 2017-02-12 LAB — T4 AND TSH
T4 TOTAL: 9.4 ug/dL (ref 4.5–12.0)
TSH: 0.147 u[IU]/mL — ABNORMAL LOW (ref 0.450–4.500)

## 2017-02-14 ENCOUNTER — Other Ambulatory Visit: Payer: Self-pay | Admitting: Internal Medicine

## 2017-02-15 ENCOUNTER — Other Ambulatory Visit: Payer: Self-pay | Admitting: Internal Medicine

## 2017-02-15 ENCOUNTER — Encounter: Payer: Self-pay | Admitting: Internal Medicine

## 2017-02-15 DIAGNOSIS — R7989 Other specified abnormal findings of blood chemistry: Secondary | ICD-10-CM

## 2017-02-23 ENCOUNTER — Telehealth: Payer: Self-pay | Admitting: Cardiology

## 2017-02-23 NOTE — Telephone Encounter (Signed)
Left detailed message (ok per DPR), with instructions for tomorrow's stress echo on pt's cell VM. Provided call back number if questions.

## 2017-02-23 NOTE — Telephone Encounter (Signed)
Pt called back. Needs to reschedule tomorrow's stress echo due to scheduling conflict.  Transferred to front desk.

## 2017-02-23 NOTE — Telephone Encounter (Signed)
Pt is returning your call

## 2017-02-23 NOTE — Telephone Encounter (Signed)
Pt needs to reschedule echo. Scheduling notified.

## 2017-02-24 ENCOUNTER — Other Ambulatory Visit: Payer: BLUE CROSS/BLUE SHIELD

## 2017-02-27 ENCOUNTER — Other Ambulatory Visit: Payer: BLUE CROSS/BLUE SHIELD

## 2017-03-02 NOTE — Telephone Encounter (Signed)
Mailed unread message to patient.  

## 2017-03-03 ENCOUNTER — Other Ambulatory Visit: Payer: BLUE CROSS/BLUE SHIELD

## 2017-03-18 ENCOUNTER — Other Ambulatory Visit: Payer: Self-pay | Admitting: Internal Medicine

## 2017-03-26 ENCOUNTER — Other Ambulatory Visit: Payer: Self-pay

## 2017-03-26 MED ORDER — LEVOTHYROXINE SODIUM 25 MCG PO TABS: 25.0000 ug | ORAL_TABLET | Freq: Every day | ORAL | 0 refills | Status: DC

## 2017-04-01 ENCOUNTER — Other Ambulatory Visit: Payer: Self-pay | Admitting: Cardiology

## 2017-04-01 DIAGNOSIS — R55 Syncope and collapse: Secondary | ICD-10-CM

## 2017-04-01 DIAGNOSIS — R0609 Other forms of dyspnea: Secondary | ICD-10-CM

## 2017-04-01 DIAGNOSIS — R42 Dizziness and giddiness: Secondary | ICD-10-CM

## 2017-04-03 ENCOUNTER — Telehealth: Payer: Self-pay | Admitting: *Deleted

## 2017-04-03 NOTE — Telephone Encounter (Signed)
I attempted to call the patient twice, one time on 03/31/17 and on 04/03/17 and I have left a message for patient to call back. She didn't have her mammogram, so appointment with Dr.Sankar on 04/06/17 is canceled and needs to be rescheduled after mammogram

## 2017-04-06 ENCOUNTER — Ambulatory Visit: Payer: BLUE CROSS/BLUE SHIELD | Admitting: General Surgery

## 2017-04-15 ENCOUNTER — Other Ambulatory Visit: Payer: Self-pay

## 2017-04-15 ENCOUNTER — Other Ambulatory Visit: Payer: BLUE CROSS/BLUE SHIELD

## 2017-04-15 ENCOUNTER — Ambulatory Visit (INDEPENDENT_AMBULATORY_CARE_PROVIDER_SITE_OTHER): Payer: BLUE CROSS/BLUE SHIELD

## 2017-04-15 DIAGNOSIS — R55 Syncope and collapse: Secondary | ICD-10-CM | POA: Diagnosis not present

## 2017-04-15 DIAGNOSIS — I10 Essential (primary) hypertension: Secondary | ICD-10-CM | POA: Diagnosis not present

## 2017-04-15 DIAGNOSIS — R0609 Other forms of dyspnea: Secondary | ICD-10-CM | POA: Diagnosis not present

## 2017-04-15 DIAGNOSIS — R42 Dizziness and giddiness: Secondary | ICD-10-CM | POA: Diagnosis not present

## 2017-04-16 ENCOUNTER — Encounter: Payer: Self-pay | Admitting: General Surgery

## 2017-04-16 DIAGNOSIS — Z1231 Encounter for screening mammogram for malignant neoplasm of breast: Secondary | ICD-10-CM | POA: Diagnosis not present

## 2017-04-29 ENCOUNTER — Telehealth: Payer: Self-pay | Admitting: Cardiovascular Disease

## 2017-04-29 NOTE — Telephone Encounter (Signed)
Left detailed voicemail message regarding stress echocardiogram scheduled tomorrow for 08:30 AM with instructions and location information with our number to call back if any further questions.

## 2017-04-30 ENCOUNTER — Ambulatory Visit (INDEPENDENT_AMBULATORY_CARE_PROVIDER_SITE_OTHER): Payer: BLUE CROSS/BLUE SHIELD

## 2017-04-30 DIAGNOSIS — R0609 Other forms of dyspnea: Secondary | ICD-10-CM

## 2017-04-30 DIAGNOSIS — R42 Dizziness and giddiness: Secondary | ICD-10-CM

## 2017-04-30 DIAGNOSIS — I10 Essential (primary) hypertension: Secondary | ICD-10-CM | POA: Diagnosis not present

## 2017-04-30 DIAGNOSIS — R55 Syncope and collapse: Secondary | ICD-10-CM | POA: Diagnosis not present

## 2017-04-30 LAB — ECHOCARDIOGRAM STRESS TEST
CHL CUP MPHR: 171 {beats}/min
CSEPED: 8 min
CSEPEW: 10.1 METS
Exercise duration (sec): 38 s
Peak HR: 173 {beats}/min
Percent HR: 101 %
Rest HR: 73 {beats}/min

## 2017-05-12 ENCOUNTER — Encounter: Payer: Self-pay | Admitting: General Surgery

## 2017-05-12 ENCOUNTER — Ambulatory Visit (INDEPENDENT_AMBULATORY_CARE_PROVIDER_SITE_OTHER): Payer: BLUE CROSS/BLUE SHIELD | Admitting: General Surgery

## 2017-05-12 VITALS — BP 130/80 | HR 92 | Resp 12 | Ht 63.0 in | Wt 175.0 lb

## 2017-05-12 DIAGNOSIS — N6011 Diffuse cystic mastopathy of right breast: Secondary | ICD-10-CM | POA: Diagnosis not present

## 2017-05-12 NOTE — Patient Instructions (Signed)
Patient to return to her PCP for mammogram and breast checks.The patient is aware to call back for any questions or concerns. 

## 2017-05-12 NOTE — Progress Notes (Signed)
Patient ID: Bethany Fernandez, female   DOB: [birth year], 52 y.o.   MRN: [id number]  Chief Complaint  Patient presents with  . Follow-up    mammogram    HPI Bethany Fernandez is a 51 y.o. female. who presents for a breast evaluation. The most recent mammogram was done on 04-16-17.  Patient does perform regular self breast checks and gets regular mammograms done.   No new breast issues.  HPI  Past Medical History:  Diagnosis Date  . Chicken pox   . Fibrocystic breast 2013  . Hyperlipidemia   . Hypertension 1999  . Migraines     Past Surgical History:  Procedure Laterality Date  . ABDOMINAL HYSTERECTOMY    . BREAST BIOPSY  2013   right  . ECTOPIC PREGNANCY SURGERY    . PARTIAL HYSTERECTOMY  2012    Family History  Problem Relation Age of Onset  . Lung disease Maternal Grandfather 25  . Alcohol abuse Maternal Grandfather   . Hyperlipidemia Mother   . Hypertension Mother   . Hyperlipidemia Father   . Hypertension Father   . Diabetes Father   . Stroke Paternal Grandfather   . Breast cancer Cousin 50  . Kidney disease Maternal Uncle   . Heart disease Maternal Aunt 38       massive AMI, died    Social History Social History  Substance Use Topics  . Smoking status: Never Smoker  . Smokeless tobacco: Never Used  . Alcohol use 0.0 oz/week     Comment: occ    Allergies  Allergen Reactions  . Tape Rash    Current Outpatient Prescriptions  Medication Sig Dispense Refill  . amLODipine (NORVASC) 5 MG tablet TAKE 1 TABLET (5 MG TOTAL) BY MOUTH DAILY. 30 tablet 3  . atorvastatin (LIPITOR) 20 MG tablet TAKE 1 TABLET (20 MG TOTAL) BY MOUTH DAILY. 90 tablet 3  . calcium-vitamin D (OSCAL WITH D) 250-125 MG-UNIT per tablet Take 1 tablet by mouth daily.    . ergocalciferol (DRISDOL) 50000 units capsule Take 1 capsule (50,000 Units total) by mouth once a week. 8 capsule 1  . furosemide (LASIX) 20 MG tablet TAKE 1 TABLET BY MOUTH EVERY DAY AS NEEDED FOR FLUID 90 tablet 1  .  levothyroxine (SYNTHROID, LEVOTHROID) 25 MCG tablet Take 1 tablet (25 mcg total) by mouth daily before breakfast. 90 tablet 0  . Multiple Vitamin (MULTI VITAMIN DAILY PO) Take by mouth.     No current facility-administered medications for this visit.     Review of Systems Review of Systems  Constitutional: Negative.   Respiratory: Negative.   Cardiovascular: Negative.     Blood pressure 130/80, pulse 92, resp. rate 12, height 5\' 3"  (1.6 m), weight 175 lb (79.4 kg).  Physical Exam Physical Exam  Constitutional: She is oriented to person, place, and time. She appears well-developed and well-nourished.  Eyes: Conjunctivae are normal. No scleral icterus.  Neck: Neck supple.  Cardiovascular: Normal rate, regular rhythm and normal heart sounds.   Pulmonary/Chest: Effort normal and breath sounds normal. Right breast exhibits no inverted nipple, no mass, no nipple discharge, no skin change and no tenderness. Left breast exhibits no inverted nipple, no mass, no nipple discharge, no skin change and no tenderness.  both nipple have transverse splits   Abdominal: Soft. Bowel sounds are normal.  Lymphadenopathy:    She has no cervical adenopathy.    She has no axillary adenopathy.  Neurological: She is alert and oriented to person, place,  and time.  Skin: Skin is warm and dry.    Data Reviewed Mammogram reviewed   Assessment    History of fibrocyst disease and sporadic benign nipple discharge.    Plan    Patient to return to her PCP for mammogram and breast checks.  The patient is aware to call back for any questions or concerns  HPI, Physical Exam, Assessment and Plan have been scribed under the direction and in the presence of Kathreen Cosier, MD Dorathy Daft, RN     I have completed the exam and reviewed the above documentation for accuracy and completeness.  I agree with the above.  Museum/gallery conservator has been used and any errors in dictation or transcription are  unintentional.  Simrit Gohlke G. Evette Cristal, M.D., F.A.C.S.    Gerlene Burdock G 05/12/2017, 6:58 PM

## 2017-06-21 ENCOUNTER — Other Ambulatory Visit: Payer: Self-pay | Admitting: Internal Medicine

## 2017-07-30 ENCOUNTER — Other Ambulatory Visit: Payer: Self-pay | Admitting: Internal Medicine

## 2017-07-31 ENCOUNTER — Emergency Department
Admission: EM | Admit: 2017-07-31 | Discharge: 2017-07-31 | Disposition: A | Discharge status: Home / Self Care | Payer: BLUE CROSS/BLUE SHIELD | Attending: Emergency Medicine | Admitting: Emergency Medicine

## 2017-07-31 ENCOUNTER — Emergency Department: Payer: BLUE CROSS/BLUE SHIELD

## 2017-07-31 ENCOUNTER — Other Ambulatory Visit: Payer: Self-pay

## 2017-07-31 ENCOUNTER — Encounter: Payer: Self-pay | Admitting: Emergency Medicine

## 2017-07-31 DIAGNOSIS — Y9389 Activity, other specified: Secondary | ICD-10-CM | POA: Insufficient documentation

## 2017-07-31 DIAGNOSIS — M7918 Myalgia, other site: Secondary | ICD-10-CM

## 2017-07-31 DIAGNOSIS — S8981XA Other specified injuries of right lower leg, initial encounter: Secondary | ICD-10-CM | POA: Diagnosis present

## 2017-07-31 DIAGNOSIS — Y92410 Unspecified street and highway as the place of occurrence of the external cause: Secondary | ICD-10-CM | POA: Diagnosis not present

## 2017-07-31 DIAGNOSIS — I10 Essential (primary) hypertension: Secondary | ICD-10-CM | POA: Insufficient documentation

## 2017-07-31 DIAGNOSIS — Z79899 Other long term (current) drug therapy: Secondary | ICD-10-CM | POA: Insufficient documentation

## 2017-07-31 DIAGNOSIS — M791 Myalgia, unspecified site: Secondary | ICD-10-CM | POA: Diagnosis not present

## 2017-07-31 DIAGNOSIS — Y999 Unspecified external cause status: Secondary | ICD-10-CM | POA: Diagnosis not present

## 2017-07-31 DIAGNOSIS — M545 Low back pain: Secondary | ICD-10-CM | POA: Insufficient documentation

## 2017-07-31 DIAGNOSIS — M79661 Pain in right lower leg: Secondary | ICD-10-CM | POA: Diagnosis not present

## 2017-07-31 DIAGNOSIS — S8011XA Contusion of right lower leg, initial encounter: Secondary | ICD-10-CM | POA: Diagnosis not present

## 2017-07-31 MED ORDER — DIAZEPAM 5 MG PO TABS: 5.0000 mg | ORAL_TABLET | Freq: Once | ORAL | Status: DC

## 2017-07-31 MED ORDER — DIAZEPAM 5 MG PO TABS
5.0000 mg | ORAL_TABLET | Freq: Once | ORAL | Status: AC
  Administered 2017-07-31: 5 mg via ORAL
  Filled 2017-07-31: qty 1

## 2017-07-31 MED ORDER — NAPROXEN 500 MG PO TABS
500.0000 mg | ORAL_TABLET | Freq: Once | ORAL | Status: AC
  Administered 2017-07-31: 500 mg via ORAL
  Filled 2017-07-31: qty 1

## 2017-07-31 MED ORDER — DIAZEPAM 2 MG PO TABS: 2.0000 mg | ORAL_TABLET | Freq: Three times a day (TID) | ORAL | 0 refills | Status: DC | PRN

## 2017-07-31 MED ORDER — KETOROLAC TROMETHAMINE 30 MG/ML IJ SOLN: 30.0000 mg | Freq: Once | INTRAMUSCULAR | Status: DC

## 2017-07-31 MED ORDER — NAPROXEN 500 MG PO TABS: 500.0000 mg | ORAL_TABLET | Freq: Two times a day (BID) | ORAL | 0 refills | Status: DC
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
## 2017-07-31 NOTE — Discharge Instructions (Signed)
Please use ice on the sore areas 20 minutes per hour while awake. Rest, ice, and elevate the right lower extremity often for the next few days. Follow-up with your primary care provider for symptoms that are not improving over the week. Return to the emergency department for symptoms that change or worsen if unable to schedule an appointment.
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
## 2017-08-03 ENCOUNTER — Ambulatory Visit: Payer: BLUE CROSS/BLUE SHIELD | Admitting: Family Medicine

## 2017-08-03 ENCOUNTER — Encounter: Payer: Self-pay | Admitting: Family Medicine

## 2017-08-03 VITALS — BP 130/82 | HR 68 | Temp 98.3°F | Ht 63.0 in

## 2017-08-03 DIAGNOSIS — S8011XD Contusion of right lower leg, subsequent encounter: Secondary | ICD-10-CM

## 2017-08-03 MED ORDER — NAPROXEN 500 MG PO TABS: 500.0000 mg | ORAL_TABLET | Freq: Two times a day (BID) | ORAL | 0 refills | Status: DC

## 2017-08-03 NOTE — Patient Instructions (Signed)
It was a pleasure to see you today.  Please use rest, elevation, and you can use heat for 15 minutes every hour for discomfort.   Also, continue naproxen as directed with food and you can also use acetaminophen in between doses. Please follow medication dose of acetaminophen that is on the bottle and do not exceed dose.  If symptoms do not improve, worsen, or you develop increased swelling or pain after initiation of treatment, please follow up for further evaluation and treatment.    Contusion A contusion is a deep bruise. Contusions happen when an injury causes bleeding under the skin. Symptoms of bruising include pain, swelling, and discolored skin. The skin may turn blue, purple, or yellow. Follow these instructions at home:  Rest the injured area.  If told, put ice on the injured area. ? Put ice in a plastic bag. ? Place a towel between your skin and the bag. ? Leave the ice on for 20 minutes, 2-3 times per day.  If told, put light pressure (compression) on the injured area using an elastic bandage. Make sure the bandage is not too tight. Remove it and put it back on as told by your doctor.  If possible, raise (elevate) the injured area above the level of your heart while you are sitting or lying down.  Take over-the-counter and prescription medicines only as told by your doctor. Contact a doctor if:  Your symptoms do not get better after several days of treatment.  Your symptoms get worse.  You have trouble moving the injured area. Get help right away if:  You have very bad pain.  You have a loss of feeling (numbness) in a hand or foot.  Your hand or foot turns pale or cold. This information is not intended to replace advice given to you by your health care provider. Make sure you discuss any questions you have with your health care provider. Document Released: 02/18/2008 Document Revised: 02/07/2016 Document Reviewed: 01/17/2015 Elsevier Interactive Patient Education   2018 [employer]
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
## 2017-08-10 ENCOUNTER — Encounter: Payer: Self-pay | Admitting: Family Medicine

## 2017-08-10 ENCOUNTER — Ambulatory Visit (INDEPENDENT_AMBULATORY_CARE_PROVIDER_SITE_OTHER): Payer: Self-pay | Admitting: Family Medicine

## 2017-08-10 VITALS — BP 128/90 | HR 91 | Temp 98.3°F | Wt 183.4 lb

## 2017-08-10 DIAGNOSIS — T148XXA Other injury of unspecified body region, initial encounter: Secondary | ICD-10-CM

## 2017-08-10 NOTE — Patient Instructions (Signed)
Please continue naproxen and acetaminophen as directed.  You will be contact about your referral.  Please let us know if you have not heard back within 1 week about your referral. Follow up with your provider if symptoms do not improve with treatment, worsen, or you develop new symptoms.    Hematoma A hematoma is a collection of blood. The collection of blood can turn into a hard, painful lump under the skin. Your skin may turn blue or yellow if the hematoma is close to the surface of the skin. Most hematomas get better in a few days to weeks. Some hematomas are serious and need medical care. Hematomas can be very small or very big. Follow these instructions at home:  Apply ice to the injured area: ? Put ice in a plastic bag. ? Place a towel between your skin and the bag. ? Leave the ice on for 20 minutes, 2-3 times a day for the first 1 to 2 days.  After the first 2 days, switch to using warm packs on the injured area.  Raise (elevate) the injured area to lessen pain and puffiness (swelling). You may also wrap the area with an elastic bandage. Make sure the bandage is not wrapped too tight.  If you have a painful hematoma on your leg or foot, you may use crutches for a couple days.  Only take medicines as told by your doctor. Get help right away if:  Your pain gets worse.  Your pain is not controlled with medicine.  You have a fever.  Your puffiness gets worse.  Your skin turns more blue or yellow.  Your skin over the hematoma breaks or starts bleeding.  Your hematoma is in your chest or belly (abdomen) and you are short of breath, feel weak, or have a change in consciousness.  Your hematoma is on your scalp and you have a headache that gets worse or a change in alertness or consciousness. This information is not intended to replace advice given to you by your health care provider. Make sure you discuss any questions you have with your health care provider. Document Released:  10/09/2004 Document Revised: 02/07/2016 Document Reviewed: 02/09/2013 Elsevier Interactive Patient Education  2017 [employer]
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
## 2017-08-11 DIAGNOSIS — S8011XA Contusion of right lower leg, initial encounter: Secondary | ICD-10-CM | POA: Diagnosis not present

## 2017-08-19 ENCOUNTER — Other Ambulatory Visit: Payer: Self-pay | Admitting: Internal Medicine

## 2017-08-19 DIAGNOSIS — S8011XA Contusion of right lower leg, initial encounter: Secondary | ICD-10-CM | POA: Diagnosis not present

## 2017-09-01 ENCOUNTER — Other Ambulatory Visit: Payer: Self-pay | Admitting: Family Medicine

## 2017-09-02 NOTE — Telephone Encounter (Signed)
Pt is requesting for refill on Naproxen 500 mg, please Adviced

## 2017-09-11 DIAGNOSIS — S8011XA Contusion of right lower leg, initial encounter: Secondary | ICD-10-CM | POA: Diagnosis not present

## 2017-09-26 ENCOUNTER — Other Ambulatory Visit: Payer: Self-pay | Admitting: Internal Medicine

## 2017-11-18 DIAGNOSIS — M9903 Segmental and somatic dysfunction of lumbar region: Secondary | ICD-10-CM | POA: Diagnosis not present

## 2017-11-18 DIAGNOSIS — M9902 Segmental and somatic dysfunction of thoracic region: Secondary | ICD-10-CM | POA: Diagnosis not present

## 2017-11-18 DIAGNOSIS — M531 Cervicobrachial syndrome: Secondary | ICD-10-CM | POA: Diagnosis not present

## 2017-11-23 DIAGNOSIS — M25569 Pain in unspecified knee: Secondary | ICD-10-CM | POA: Diagnosis not present

## 2017-12-05 ENCOUNTER — Other Ambulatory Visit: Payer: Self-pay | Admitting: Internal Medicine

## 2017-12-08 DIAGNOSIS — M9904 Segmental and somatic dysfunction of sacral region: Secondary | ICD-10-CM | POA: Diagnosis not present

## 2017-12-08 DIAGNOSIS — M9903 Segmental and somatic dysfunction of lumbar region: Secondary | ICD-10-CM | POA: Diagnosis not present

## 2017-12-08 DIAGNOSIS — M9902 Segmental and somatic dysfunction of thoracic region: Secondary | ICD-10-CM | POA: Diagnosis not present

## 2017-12-11 DIAGNOSIS — M9902 Segmental and somatic dysfunction of thoracic region: Secondary | ICD-10-CM | POA: Diagnosis not present

## 2017-12-11 DIAGNOSIS — M9903 Segmental and somatic dysfunction of lumbar region: Secondary | ICD-10-CM | POA: Diagnosis not present

## 2017-12-15 DIAGNOSIS — M9903 Segmental and somatic dysfunction of lumbar region: Secondary | ICD-10-CM | POA: Diagnosis not present

## 2017-12-15 DIAGNOSIS — M9904 Segmental and somatic dysfunction of sacral region: Secondary | ICD-10-CM | POA: Diagnosis not present

## 2017-12-27 ENCOUNTER — Other Ambulatory Visit: Payer: Self-pay | Admitting: Internal Medicine

## 2017-12-31 ENCOUNTER — Encounter: Payer: Self-pay | Admitting: Internal Medicine

## 2017-12-31 ENCOUNTER — Other Ambulatory Visit (HOSPITAL_COMMUNITY)
Admission: RE | Admit: 2017-12-31 | Discharge: 2017-12-31 | Disposition: A | Discharge status: Home / Self Care | Payer: BLUE CROSS/BLUE SHIELD | Source: Ambulatory Visit | Attending: Internal Medicine | Admitting: Internal Medicine

## 2017-12-31 ENCOUNTER — Ambulatory Visit (INDEPENDENT_AMBULATORY_CARE_PROVIDER_SITE_OTHER): Payer: BLUE CROSS/BLUE SHIELD | Admitting: Internal Medicine

## 2017-12-31 VITALS — BP 126/78 | HR 90 | Temp 98.3°F | Resp 15 | Ht 63.0 in | Wt 183.4 lb

## 2017-12-31 DIAGNOSIS — Z79899 Other long term (current) drug therapy: Secondary | ICD-10-CM | POA: Diagnosis not present

## 2017-12-31 DIAGNOSIS — E7849 Other hyperlipidemia: Secondary | ICD-10-CM | POA: Diagnosis not present

## 2017-12-31 DIAGNOSIS — Z124 Encounter for screening for malignant neoplasm of cervix: Secondary | ICD-10-CM

## 2017-12-31 DIAGNOSIS — R7301 Impaired fasting glucose: Secondary | ICD-10-CM

## 2017-12-31 DIAGNOSIS — Z1231 Encounter for screening mammogram for malignant neoplasm of breast: Secondary | ICD-10-CM | POA: Diagnosis not present

## 2017-12-31 DIAGNOSIS — Z1211 Encounter for screening for malignant neoplasm of colon: Secondary | ICD-10-CM | POA: Diagnosis not present

## 2017-12-31 DIAGNOSIS — Z0000 Encounter for general adult medical examination without abnormal findings: Secondary | ICD-10-CM

## 2017-12-31 DIAGNOSIS — I10 Essential (primary) hypertension: Secondary | ICD-10-CM

## 2017-12-31 DIAGNOSIS — E7801 Familial hypercholesterolemia: Secondary | ICD-10-CM | POA: Diagnosis not present

## 2017-12-31 DIAGNOSIS — R5383 Other fatigue: Secondary | ICD-10-CM | POA: Diagnosis not present

## 2017-12-31 DIAGNOSIS — Z1239 Encounter for other screening for malignant neoplasm of breast: Secondary | ICD-10-CM

## 2017-12-31 LAB — LIPID PANEL
CHOLESTEROL: 212 mg/dL — AB (ref 0–200)
HDL: 57.3 mg/dL (ref >39.00)
LDL Cholesterol: 141 mg/dL — ABNORMAL HIGH (ref 0–99)
NonHDL: 154.98
Total CHOL/HDL Ratio: 4
Triglycerides: 71 mg/dL (ref 0.0–149.0)
VLDL: 14.2 mg/dL (ref 0.0–40.0)

## 2017-12-31 LAB — COMPREHENSIVE METABOLIC PANEL
ALBUMIN: 4.2 g/dL (ref 3.5–5.2)
ALK PHOS: 74 U/L (ref 39–117)
ALT: 19 U/L (ref 0–35)
AST: 19 U/L (ref 0–37)
BUN: 10 mg/dL (ref 6–23)
CO2: 31 mEq/L (ref 19–32)
Calcium: 9.5 mg/dL (ref 8.4–10.5)
Chloride: 103 mEq/L (ref 96–112)
Creatinine, Ser: 1.02 mg/dL (ref 0.40–1.20)
GFR: 73.76 mL/min (ref >60.00)
Glucose, Bld: 98 mg/dL (ref 70–99)
POTASSIUM: 4.3 meq/L (ref 3.5–5.1)
Sodium: 139 mEq/L (ref 135–145)
TOTAL PROTEIN: 7.3 g/dL (ref 6.0–8.3)
Total Bilirubin: 0.9 mg/dL (ref 0.2–1.2)

## 2017-12-31 LAB — CBC WITH DIFFERENTIAL/PLATELET
Basophils Absolute: 0 10*3/uL (ref 0.0–0.1)
Basophils Relative: 0.7 % (ref 0.0–3.0)
EOS PCT: 1.6 % (ref 0.0–5.0)
Eosinophils Absolute: 0.1 10*3/uL (ref 0.0–0.7)
HEMATOCRIT: 40.9 % (ref 36.0–46.0)
Hemoglobin: 13.7 g/dL (ref 12.0–15.0)
LYMPHS ABS: 1.7 10*3/uL (ref 0.7–4.0)
Lymphocytes Relative: 37.6 % (ref 12.0–46.0)
MCHC: 33.4 g/dL (ref 30.0–36.0)
MCV: 87.9 fl (ref 78.0–100.0)
MONOS PCT: 8.5 % (ref 3.0–12.0)
Monocytes Absolute: 0.4 10*3/uL (ref 0.1–1.0)
NEUTROS PCT: 51.6 % (ref 43.0–77.0)
Neutro Abs: 2.4 10*3/uL (ref 1.4–7.7)
Platelets: 266 10*3/uL (ref 150.0–400.0)
RBC: 4.65 Mil/uL (ref 3.87–5.11)
RDW: 14.4 % (ref 11.5–15.5)
WBC: 4.6 10*3/uL (ref 4.0–10.5)

## 2017-12-31 LAB — TSH: TSH: 0.75 u[IU]/mL (ref 0.35–4.50)

## 2017-12-31 LAB — HEMOGLOBIN A1C: Hgb A1c MFr Bld: 5.9 % (ref 4.6–6.5)

## 2017-12-31 NOTE — Patient Instructions (Signed)
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
## 2018-01-01 LAB — CYTOLOGY - PAP
Diagnosis: NEGATIVE
HPV (WINDOPATH): NOT DETECTED

## 2018-01-02 NOTE — Assessment & Plan Note (Signed)
Well controlled on current regimen. Renal function stable, no changes today.  Lab Results  Component Value Date   CREATININE 1.02 12/31/2017   Lab Results  Component Value Date   NA 139 12/31/2017   K 4.3 12/31/2017   CL 103 12/31/2017   CO2 31 12/31/2017

## 2018-01-02 NOTE — Assessment & Plan Note (Signed)
Annual comprehensive preventive exam was done as well as an evaluation and management of chronic conditions .  During the course of the visit the patient was educated and counseled about appropriate screening and preventive services including :  diabetes screening, lipid analysis with projected  10 year  risk for CAD , nutrition counseling, breast, cervical and colorectal cancer screening, and recommended immunizations.  Printed recommendations for health maintenance screenings was given 

## 2018-01-02 NOTE — Assessment & Plan Note (Signed)
Previously managed with lipitor which she resumed in January . LFTs  Normal  Lab Results  Component Value Date   CHOL 212 (H) 12/31/2017   HDL 57.30 12/31/2017   LDLCALC 141 (H) 12/31/2017   LDLDIRECT 103.0 02/11/2017   TRIG 71.0 12/31/2017   CHOLHDL 4 12/31/2017   Lab Results  Component Value Date   ALT 19 12/31/2017   AST 19 12/31/2017   ALKPHOS 74 12/31/2017   BILITOT 0.9 12/31/2017

## 2018-01-05 ENCOUNTER — Other Ambulatory Visit: Payer: BLUE CROSS/BLUE SHIELD

## 2018-01-05 DIAGNOSIS — I10 Essential (primary) hypertension: Secondary | ICD-10-CM | POA: Diagnosis not present

## 2018-01-05 LAB — MICROALBUMIN / CREATININE URINE RATIO
CREATININE, U: 366.4 mg/dL
MICROALB/CREAT RATIO: 0.2 mg/g (ref 0.0–30.0)
Microalb, Ur: 0.8 mg/dL (ref 0.0–1.9)

## 2018-01-13 ENCOUNTER — Other Ambulatory Visit: Payer: Self-pay | Admitting: Internal Medicine

## 2018-01-15 ENCOUNTER — Telehealth: Payer: Self-pay

## 2018-01-15 ENCOUNTER — Other Ambulatory Visit: Payer: Self-pay

## 2018-01-15 DIAGNOSIS — M5386 Other specified dorsopathies, lumbar region: Secondary | ICD-10-CM | POA: Diagnosis not present

## 2018-01-15 DIAGNOSIS — Z1211 Encounter for screening for malignant neoplasm of colon: Secondary | ICD-10-CM

## 2018-01-15 DIAGNOSIS — M531 Cervicobrachial syndrome: Secondary | ICD-10-CM | POA: Diagnosis not present

## 2018-01-15 DIAGNOSIS — M9902 Segmental and somatic dysfunction of thoracic region: Secondary | ICD-10-CM | POA: Diagnosis not present

## 2018-01-15 MED ORDER — BISACODYL 5 MG PO TBEC: 10.0000 mg | DELAYED_RELEASE_TABLET | Freq: Once | ORAL | 0 refills | Status: AC

## 2018-01-15 MED ORDER — PEG 3350-KCL-NA BICARB-NACL 420 G PO SOLR: 4000.0000 mL | Freq: Once | ORAL | 0 refills | Status: AC

## 2018-01-15 NOTE — Telephone Encounter (Signed)
Spoke with pt and scheduled her colonoscopy with Dr. Maximino Greenland for 02/03/18 at [facility]. Triage form completed.

## 2018-01-28 DIAGNOSIS — H109 Unspecified conjunctivitis: Secondary | ICD-10-CM | POA: Diagnosis not present

## 2018-02-03 ENCOUNTER — Ambulatory Visit
Admission: RE | Admit: 2018-02-03 | Discharge: 2018-02-03 | Disposition: A | Discharge status: Home / Self Care | Payer: BLUE CROSS/BLUE SHIELD | Source: Ambulatory Visit | Attending: Gastroenterology | Admitting: Gastroenterology

## 2018-02-03 ENCOUNTER — Encounter
Admission: RE | Disposition: A | Discharge status: Home / Self Care | Payer: Self-pay | Source: Ambulatory Visit | Attending: Gastroenterology

## 2018-02-03 ENCOUNTER — Other Ambulatory Visit: Payer: Self-pay

## 2018-02-03 ENCOUNTER — Encounter: Payer: Self-pay | Admitting: Student

## 2018-02-03 ENCOUNTER — Ambulatory Visit: Payer: BLUE CROSS/BLUE SHIELD | Admitting: Anesthesiology

## 2018-02-03 DIAGNOSIS — Z0000 Encounter for general adult medical examination without abnormal findings: Secondary | ICD-10-CM

## 2018-02-03 DIAGNOSIS — Z79899 Other long term (current) drug therapy: Secondary | ICD-10-CM | POA: Insufficient documentation

## 2018-02-03 DIAGNOSIS — I10 Essential (primary) hypertension: Secondary | ICD-10-CM | POA: Insufficient documentation

## 2018-02-03 DIAGNOSIS — Z1211 Encounter for screening for malignant neoplasm of colon: Secondary | ICD-10-CM | POA: Diagnosis not present

## 2018-02-03 DIAGNOSIS — E785 Hyperlipidemia, unspecified: Secondary | ICD-10-CM | POA: Diagnosis not present

## 2018-02-03 HISTORY — PX: COLONOSCOPY WITH PROPOFOL: SHX5780

## 2018-02-03 SURGERY — COLONOSCOPY WITH PROPOFOL
Anesthesia: General | Wound class: Contaminated

## 2018-02-03 MED ORDER — PROPOFOL 500 MG/50ML IV EMUL
INTRAVENOUS | Status: AC
  Filled 2018-02-03: qty 50

## 2018-02-03 MED ORDER — PROPOFOL 10 MG/ML IV BOLUS
INTRAVENOUS | Status: DC | PRN
  Administered 2018-02-03: 100 mg via INTRAVENOUS

## 2018-02-03 MED ORDER — MIDAZOLAM HCL 2 MG/2ML IJ SOLN
INTRAMUSCULAR | Status: DC | PRN
  Administered 2018-02-03: 2 mg via INTRAVENOUS

## 2018-02-03 MED ORDER — ONDANSETRON HCL 4 MG/2ML IJ SOLN
INTRAMUSCULAR | Status: DC | PRN
  Administered 2018-02-03: 4 mg via INTRAVENOUS

## 2018-02-03 MED ORDER — PROPOFOL 500 MG/50ML IV EMUL
INTRAVENOUS | Status: DC | PRN
  Administered 2018-02-03: 100 ug/kg/min via INTRAVENOUS

## 2018-02-03 MED ORDER — FENTANYL CITRATE (PF) 100 MCG/2ML IJ SOLN
INTRAMUSCULAR | Status: DC | PRN
  Administered 2018-02-03 (×2): 25 ug via INTRAVENOUS
  Administered 2018-02-03: 50 ug via INTRAVENOUS

## 2018-02-03 MED ORDER — PROPOFOL 10 MG/ML IV BOLUS
INTRAVENOUS | Status: AC
  Filled 2018-02-03: qty 20

## 2018-02-03 MED ORDER — SODIUM CHLORIDE 0.9 % IV SOLN
INTRAVENOUS | Status: DC
  Administered 2018-02-03: 08:00:00 via INTRAVENOUS

## 2018-02-03 MED ORDER — FENTANYL CITRATE (PF) 100 MCG/2ML IJ SOLN
INTRAMUSCULAR | Status: AC
  Filled 2018-02-03: qty 2

## 2018-02-03 MED ORDER — PHENYLEPHRINE HCL 10 MG/ML IJ SOLN
INTRAMUSCULAR | Status: DC | PRN
  Administered 2018-02-03 (×2): 100 ug via INTRAVENOUS

## 2018-02-03 MED ORDER — STERILE WATER FOR IRRIGATION IR SOLN
Status: DC | PRN
  Administered 2018-02-03: 09:00:00

## 2018-02-03 MED ORDER — MIDAZOLAM HCL 2 MG/2ML IJ SOLN
INTRAMUSCULAR | Status: AC
  Filled 2018-02-03: qty 2

## 2018-02-03 NOTE — Transfer of Care (Signed)
Immediate Anesthesia Transfer of Care Note  Patient: Bethany Fernandez  Procedure(s) Performed: COLONOSCOPY WITH PROPOFOL (N/A )  Patient Location: PACU  Anesthesia Type:General  Level of Consciousness: drowsy  Airway & Oxygen Therapy: Patient Spontanous Breathing and Patient connected to nasal cannula oxygen  Post-op Assessment: Report given to RN, Post -op Vital signs reviewed and stable and Patient moving all extremities  Post vital signs: Reviewed and stable  Last Vitals:  Vitals Value Taken Time  BP    Temp    Pulse 81 02/03/2018  9:10 AM  Resp 13 02/03/2018  9:10 AM  SpO2 99 % 02/03/2018  9:10 AM  Vitals shown include unvalidated device data.  Last Pain:  Vitals:   02/03/18 0745  TempSrc: Tympanic  PainSc: 0-No pain         Complications: No apparent anesthesia complications

## 2018-02-03 NOTE — Anesthesia Post-op Follow-up Note (Signed)
Anesthesia QCDR form completed.        

## 2018-02-03 NOTE — Anesthesia Preprocedure Evaluation (Addendum)
Anesthesia Evaluation  Patient identified by MRN, date of birth, ID band Patient awake    Reviewed: Allergy & Precautions, H&P , NPO status , Patient's Chart, lab work & pertinent test results, reviewed documented beta blocker date and time   History of Anesthesia Complications Negative for: history of anesthetic complications  Airway Mallampati: III  TM Distance: >3 FB Neck ROM: full    Dental  (+) Dental Advidsory Given, Caps, Teeth Intact   Pulmonary neg pulmonary ROS,           Cardiovascular Exercise Tolerance: Good hypertension, (-) angina(-) CAD, (-) Past MI, (-) Cardiac Stents and (-) CABG (-) dysrhythmias (-) Valvular Problems/Murmurs     Neuro/Psych negative neurological ROS  negative psych ROS   GI/Hepatic negative GI ROS, Neg liver ROS,   Endo/Other  negative endocrine ROS  Renal/GU negative Renal ROS  negative genitourinary   Musculoskeletal   Abdominal   Peds  Hematology negative hematology ROS (+)   Anesthesia Other Findings Past Medical History: No date: Chicken pox 2013: Fibrocystic breast No date: Hyperlipidemia 1999: Hypertension No date: Migraines   Reproductive/Obstetrics negative OB ROS                            Anesthesia Physical Anesthesia Plan  ASA: II  Anesthesia Plan: General   Post-op Pain Management:    Induction: Intravenous  PONV Risk Score and Plan: 3 and Propofol infusion  Airway Management Planned: Nasal Cannula  Additional Equipment:   Intra-op Plan:   Post-operative Plan:   Informed Consent: I have reviewed the patients History and Physical, chart, labs and discussed the procedure including the risks, benefits and alternatives for the proposed anesthesia with the patient or authorized representative who has indicated his/her understanding and acceptance.   Dental Advisory Given  Plan Discussed with: Anesthesiologist, CRNA and  Surgeon  Anesthesia Plan Comments:         Anesthesia Quick Evaluation

## 2018-02-03 NOTE — Op Note (Signed)
[facility] Patient Name: Bethany Fernandez Procedure Date: 02/03/2018 7:57 AM MRN: [id number] Account #: [id number] Date of Birth: 1966/09/03 Admit Type: Outpatient Age: 52 Room: [facility] ENDO ROOM 4 Gender: Female Note Status: Finalized Procedure:            Colonoscopy Indications:          Screening for colorectal malignant neoplasm Providers:            Carlisle Torgeson B. Maximino Greenland MD, MD Referring MD:         Duncan Dull, MD (Referring MD) Medicines:            Monitored Anesthesia Care Complications:        No immediate complications. Procedure:            Pre-Anesthesia Assessment:                       - Prior to the procedure, a History and Physical was                        performed, and patient medications, allergies and                        sensitivities were reviewed. The patient's tolerance of                        previous anesthesia was reviewed.                       - The risks and benefits of the procedure and the                        sedation options and risks were discussed with the                        patient. All questions were answered and informed                        consent was obtained.                       - Patient identification and proposed procedure were                        verified prior to the procedure by the physician, the                        nurse, the anesthetist and the technician. The                        procedure was verified in the pre-procedure area in the                        procedure room in the endoscopy suite.                       - ASA Grade Assessment: II - A patient with mild                        systemic disease.                       -  After reviewing the risks and benefits, the patient                        was deemed in satisfactory condition to undergo the                        procedure.                       After obtaining informed consent, the colonoscope was           passed under direct vision. Throughout the procedure,                        the patient's blood pressure, pulse, and oxygen                        saturations were monitored continuously. The                        Colonoscope was introduced through the anus and                        advanced to the the cecum, identified by appendiceal                        orifice and ileocecal valve. The colonoscopy was                        performed with ease. The patient tolerated the                        procedure well. The quality of the bowel preparation                        was good. Findings:      The perianal and digital rectal examinations were normal.      The rectum, sigmoid colon, descending colon, transverse colon, ascending       colon and cecum appeared normal.      The retroflexed view of the distal rectum and anal verge was normal and       showed no anal or rectal abnormalities. Impression:           - The rectum, sigmoid colon, descending colon,                        transverse colon, ascending colon and cecum are normal.                       - The distal rectum and anal verge are normal on                        retroflexion view.                       - No specimens collected. Recommendation:       - Discharge patient to home.                       - Resume previous diet.                       -  Continue present medications.                       - Repeat colonoscopy in 10 years for screening purposes.                       - Return to primary care physician as previously                        scheduled.                       - The findings and recommendations were discussed with                        the patient.                       - The findings and recommendations were discussed with                        the patient's family. Procedure Code(s):    --- Professional ---                       Z6109, Colorectal cancer screening; colonoscopy on                         individual not meeting criteria for high risk Diagnosis Code(s):    --- Professional ---                       Z12.11, Encounter for screening for malignant neoplasm                        of colon CPT copyright 2017 [employer]. All rights reserved. The codes documented in this report are preliminary and upon coder review may  be revised to meet current compliance requirements.  Melodie Bouillon, MD Michel Bickers B. Maximino Greenland MD, MD 02/03/2018 9:06:12 AM This report has been signed electronically. Number of Addenda: 0 Note Initiated On: 02/03/2018 7:57 AM Scope Withdrawal Time: 0 hours 17 minutes 52 seconds  Total Procedure Duration: 0 hours 27 minutes 48 seconds       [facility]

## 2018-02-03 NOTE — H&P (Signed)
Melodie Bouillon, MD [address], [place], [place], [address] [address], [place], [place], [address] Phone: [phone]  Fax: [phone]  Primary Care Physician:  Sherlene Shams, MD   Pre-Procedure History & Physical: HPI:  Bethany Fernandez is a 52 y.o. female is here for a colonoscopy.   Past Medical History:  Diagnosis Date  . Chicken pox   . Fibrocystic breast 2013  . Hyperlipidemia   . Hypertension 1999  . Migraines     Past Surgical History:  Procedure Laterality Date  . ABDOMINAL HYSTERECTOMY    . BREAST BIOPSY  2013   right  . ECTOPIC PREGNANCY SURGERY    . PARTIAL HYSTERECTOMY  2012    Prior to Admission medications   Medication Sig Start Date End Date Taking? Authorizing Provider  amLODipine (NORVASC) 5 MG tablet TAKE 1 TABLET (5 MG TOTAL) BY MOUTH DAILY. 01/14/18  Yes Sherlene Shams, MD  atorvastatin (LIPITOR) 20 MG tablet TAKE 1 TABLET (20 MG TOTAL) BY MOUTH DAILY. PLEASE CALL OFFICE FOR APPT WITH PCP FOR FURTHER REFILLS 12/28/17  Yes Sherlene Shams, MD  calcium-vitamin D (OSCAL WITH D) 250-125 MG-UNIT per tablet Take 1 tablet by mouth daily.   Yes [provider]  furosemide (LASIX) 20 MG tablet Take 20 mg by mouth.   Yes [provider]  Multiple Vitamin (MULTI VITAMIN DAILY PO) Take by mouth.   Yes [provider]    Allergies as of 01/15/2018 - Review Complete 01/15/2018  Allergen Reaction Noted  . Tape Rash 02/23/2013    Family History  Problem Relation Age of Onset  . Lung disease Maternal Grandfather 78  . Alcohol abuse Maternal Grandfather   . Hyperlipidemia Mother   . Hypertension Mother   . Hyperlipidemia Father   . Hypertension Father   . Diabetes Father   . Stroke Paternal Grandfather   . Breast cancer Cousin 50  . Kidney disease Maternal Uncle   . Heart disease Maternal Aunt 93       massive AMI, died    Social History   Socioeconomic History  . Marital status: Married   Spouse name: Not on file  . Number of children: Not on file  . Years of education: Not on file  . Highest education level: Not on file  Occupational History  . Not on file  Social Needs  . Financial resource strain: Not on file  . Food insecurity:    Worry: Not on file    Inability: Not on file  . Transportation needs:    Medical: Not on file    Non-medical: Not on file  Tobacco Use  . Smoking status: Never Smoker  . Smokeless tobacco: Never Used  Substance and Sexual Activity  . Alcohol use: Yes    Alcohol/week: 0.0 oz    Comment: occ  . Drug use: No  . Sexual activity: Not on file  Lifestyle  . Physical activity:    Days per week: Not on file    Minutes per session: Not on file  . Stress: Not on file  Relationships  . Social connections:    Talks on phone: Not on file    Gets together: Not on file    Attends religious service: Not on file    Active member of club or organization: Not on file    Attends meetings of clubs or organizations: Not on file    Relationship status: Not on file  . Intimate  partner violence:    Fear of current or ex partner: Not on file    Emotionally abused: Not on file    Physically abused: Not on file    Forced sexual activity: Not on file  Other Topics Concern  . Not on file  Social History Narrative  . Not on file    Review of Systems: See HPI, otherwise negative ROS  Physical Exam: BP (!) 138/92   Pulse 80   Temp (!) 97 F (36.1 C) (Tympanic)   Resp 16   Ht  (1.6 m)   Wt 182 lb (82.6 kg)   SpO2 98%   BMI 32.24 kg/m  General:   Alert,  pleasant and cooperative in NAD Head:  Normocephalic and atraumatic. Neck:  Supple; no masses or thyromegaly. Lungs:  Clear throughout to auscultation, normal respiratory effort.    Heart:  +S1, +S2, Regular rate and rhythm, No edema. Abdomen:  Soft, nontender and nondistended. Normal bowel sounds, without guarding, and without rebound.   Neurologic:  Alert and  oriented x4;  grossly  normal neurologically.  Impression/Plan: Bethany Fernandez is here for a colonoscopy to be performed for average risk screening.  Risks, benefits, limitations, and alternatives regarding  colonoscopy have been reviewed with the patient.  Questions have been answered.  All parties agreeable.   Pasty Spillers, MD  02/03/2018, 8:14 AM

## 2018-02-03 NOTE — Anesthesia Postprocedure Evaluation (Signed)
Anesthesia Post Note  Patient: SHANTERIA LAYE  Procedure(s) Performed: COLONOSCOPY WITH PROPOFOL (N/A )  Patient location during evaluation: Endoscopy Anesthesia Type: General Level of consciousness: awake and alert Pain management: pain level controlled Vital Signs Assessment: post-procedure vital signs reviewed and stable Respiratory status: spontaneous breathing, nonlabored ventilation, respiratory function stable and patient connected to nasal cannula oxygen Cardiovascular status: blood pressure returned to baseline and stable Postop Assessment: no apparent nausea or vomiting Anesthetic complications: no     Last Vitals:  Vitals:   02/03/18 0920 02/03/18 0940  BP: 106/72 (!) 118/99  Pulse: 69 61  Resp: 18 15  Temp:    SpO2: 99% 100%    Last Pain:  Vitals:   02/03/18 0745  TempSrc: Tympanic  PainSc: 0-No pain                 Lenard Simmer

## 2018-02-05 ENCOUNTER — Encounter: Payer: Self-pay | Admitting: Gastroenterology

## 2018-02-12 DIAGNOSIS — M5386 Other specified dorsopathies, lumbar region: Secondary | ICD-10-CM | POA: Diagnosis not present

## 2018-02-12 DIAGNOSIS — M9903 Segmental and somatic dysfunction of lumbar region: Secondary | ICD-10-CM | POA: Diagnosis not present

## 2018-02-16 DIAGNOSIS — M5386 Other specified dorsopathies, lumbar region: Secondary | ICD-10-CM | POA: Diagnosis not present

## 2018-02-16 DIAGNOSIS — M9903 Segmental and somatic dysfunction of lumbar region: Secondary | ICD-10-CM | POA: Diagnosis not present

## 2018-02-23 DIAGNOSIS — M545 Low back pain: Secondary | ICD-10-CM | POA: Diagnosis not present

## 2018-03-04 DIAGNOSIS — S39012S Strain of muscle, fascia and tendon of lower back, sequela: Secondary | ICD-10-CM | POA: Diagnosis not present

## 2018-03-04 DIAGNOSIS — M545 Low back pain: Secondary | ICD-10-CM | POA: Diagnosis not present

## 2018-03-10 DIAGNOSIS — M545 Low back pain: Secondary | ICD-10-CM | POA: Diagnosis not present

## 2018-03-10 DIAGNOSIS — S39012S Strain of muscle, fascia and tendon of lower back, sequela: Secondary | ICD-10-CM | POA: Diagnosis not present

## 2018-03-16 DIAGNOSIS — S39012S Strain of muscle, fascia and tendon of lower back, sequela: Secondary | ICD-10-CM | POA: Diagnosis not present

## 2018-03-16 DIAGNOSIS — M545 Low back pain: Secondary | ICD-10-CM | POA: Diagnosis not present

## 2018-03-25 DIAGNOSIS — M545 Low back pain: Secondary | ICD-10-CM | POA: Diagnosis not present

## 2018-03-25 DIAGNOSIS — S39012S Strain of muscle, fascia and tendon of lower back, sequela: Secondary | ICD-10-CM | POA: Diagnosis not present

## 2018-03-30 DIAGNOSIS — M545 Low back pain: Secondary | ICD-10-CM | POA: Diagnosis not present

## 2018-03-30 DIAGNOSIS — S39012S Strain of muscle, fascia and tendon of lower back, sequela: Secondary | ICD-10-CM | POA: Diagnosis not present

## 2018-05-13 DIAGNOSIS — Z1231 Encounter for screening mammogram for malignant neoplasm of breast: Secondary | ICD-10-CM | POA: Diagnosis not present

## 2018-05-13 LAB — HM MAMMOGRAPHY

## 2018-05-24 ENCOUNTER — Telehealth: Payer: Self-pay | Admitting: Internal Medicine

## 2018-05-24 NOTE — Telephone Encounter (Signed)
My Chart message sent

## 2018-06-09 DIAGNOSIS — S66912A Strain of unspecified muscle, fascia and tendon at wrist and hand level, left hand, initial encounter: Secondary | ICD-10-CM | POA: Diagnosis not present

## 2018-06-09 DIAGNOSIS — M778 Other enthesopathies, not elsewhere classified: Secondary | ICD-10-CM | POA: Diagnosis not present

## 2018-06-27 ENCOUNTER — Other Ambulatory Visit: Payer: Self-pay | Admitting: Internal Medicine

## 2018-10-23 ENCOUNTER — Other Ambulatory Visit: Payer: Self-pay | Admitting: Internal Medicine

## 2018-10-29 ENCOUNTER — Other Ambulatory Visit: Payer: Self-pay | Admitting: Internal Medicine

## 2018-11-01 DIAGNOSIS — J028 Acute pharyngitis due to other specified organisms: Secondary | ICD-10-CM | POA: Diagnosis not present

## 2019-01-03 ENCOUNTER — Encounter: Payer: BLUE CROSS/BLUE SHIELD | Admitting: Internal Medicine

## 2019-01-27 ENCOUNTER — Other Ambulatory Visit: Payer: Self-pay | Admitting: Internal Medicine

## 2019-02-27 ENCOUNTER — Other Ambulatory Visit: Payer: Self-pay | Admitting: Internal Medicine

## 2019-03-04 ENCOUNTER — Other Ambulatory Visit: Payer: Self-pay

## 2019-03-08 ENCOUNTER — Other Ambulatory Visit: Payer: Self-pay

## 2019-03-08 ENCOUNTER — Encounter: Payer: Self-pay | Admitting: Internal Medicine

## 2019-03-08 ENCOUNTER — Ambulatory Visit (INDEPENDENT_AMBULATORY_CARE_PROVIDER_SITE_OTHER): Payer: BC Managed Care – PPO | Admitting: Internal Medicine

## 2019-03-08 VITALS — BP 140/88 | HR 82 | Temp 99.4°F | Resp 16 | Ht 63.0 in | Wt 176.0 lb

## 2019-03-08 DIAGNOSIS — Z0000 Encounter for general adult medical examination without abnormal findings: Secondary | ICD-10-CM | POA: Diagnosis not present

## 2019-03-08 DIAGNOSIS — I10 Essential (primary) hypertension: Secondary | ICD-10-CM | POA: Diagnosis not present

## 2019-03-08 DIAGNOSIS — Z6832 Body mass index (BMI) 32.0-32.9, adult: Secondary | ICD-10-CM | POA: Diagnosis not present

## 2019-03-08 DIAGNOSIS — E6609 Other obesity due to excess calories: Secondary | ICD-10-CM

## 2019-03-08 DIAGNOSIS — Z1239 Encounter for other screening for malignant neoplasm of breast: Secondary | ICD-10-CM | POA: Diagnosis not present

## 2019-03-08 MED ORDER — HYDROCHLOROTHIAZIDE 25 MG PO TABS
25.0000 mg | ORAL_TABLET | Freq: Every day | ORAL | 3 refills | Status: DC
Start: 2019-03-08 — End: 2020-04-20

## 2019-03-08 NOTE — Assessment & Plan Note (Signed)

## 2019-03-08 NOTE — Patient Instructions (Signed)
Your BP is not at goal  (Goal is > 130/80) Changing furosemide to hctz 25 mg once daily in the am Continue amlodipine BUT TAKE IT AT BEDTIME   Phentermine tablets or capsules What is this medicine? PHENTERMINE (FEN ter meen) decreases your appetite. It is used with a reduced calorie diet and exercise to help you lose weight. This medicine may be used for other purposes; ask your health care provider or pharmacist if you have questions. COMMON BRAND NAME(S): Adipex-P, Atti-Plex P, Atti-Plex P Spansule, Fastin, Lomaira, Pro-Fast, Tara-8 What should I tell my health care provider before I take this medicine? They need to know if you have any of these conditions: -agitation or nervousness -diabetes -glaucoma -heart disease -high blood pressure -history of drug abuse or addiction -history of stroke -kidney disease -lung disease called Primary Pulmonary Hypertension (PPH) -taken an MAOI like Carbex, Eldepryl, Marplan, Nardil, or Parnate in last 14 days -taking stimulant medicines for attention disorders, weight loss, or to stay awake -thyroid disease -an unusual or allergic reaction to phentermine, other medicines, foods, dyes, or preservatives -pregnant or trying to get pregnant -breast-feeding How should I use this medicine? Take this medicine by mouth with a glass of water. Follow the directions on the prescription label. The instructions for use may differ based on the product and dose you are taking. Avoid taking this medicine in the evening. It may interfere with sleep. Take your doses at regular intervals. Do not take your medicine more often than directed. Talk to your pediatrician regarding the use of this medicine in children. While this drug may be prescribed for children 17 years or older for selected conditions, precautions do apply. Overdosage: If you think you have taken too much of this medicine contact a poison control center or emergency room at once. NOTE: This medicine is  only for you. Do not share this medicine with others. What if I miss a dose? If you miss a dose, take it as soon as you can. If it is almost time for your next dose, take only that dose. Do not take double or extra doses. What may interact with this medicine? Do not take this medicine with any of the following medications: -MAOIs like Carbex, Eldepryl, Marplan, Nardil, and Parnate -medicines for colds or breathing difficulties like pseudoephedrine or phenylephrine -procarbazine -sibutramine -stimulant medicines for attention disorders, weight loss, or to stay awake This medicine may also interact with the following medications: -certain medicines for depression, anxiety, or psychotic disturbances -linezolid -medicines for diabetes -medicines for high blood pressure This list may not describe all possible interactions. Give your health care provider a list of all the medicines, herbs, non-prescription drugs, or dietary supplements you use. Also tell them if you smoke, drink alcohol, or use illegal drugs. Some items may interact with your medicine. What should I watch for while using this medicine? Notify your physician immediately if you become short of breath while doing your normal activities. Do not take this medicine within 6 hours of bedtime. It can keep you from getting to sleep. Avoid drinks that contain caffeine and try to stick to a regular bedtime every night. This medicine was intended to be used in addition to a healthy diet and exercise. The best results are achieved this way. This medicine is only indicated for short-term use. Eventually your weight loss may level out. At that point, the drug will only help you maintain your new weight. Do not increase or in any way change your dose  without consulting your doctor. You may get drowsy or dizzy. Do not drive, use machinery, or do anything that needs mental alertness until you know how this medicine affects you. Do not stand or sit up  quickly, especially if you are an older patient. This reduces the risk of dizzy or fainting spells. Alcohol may increase dizziness and drowsiness. Avoid alcoholic drinks. What side effects may I notice from receiving this medicine? Side effects that you should report to your doctor or health care professional as soon as possible: -allergic reactions like skin rash, itching or hives, swelling of the face, lips, or tongue) -anxiety -breathing problems -changes in vision -chest pain or chest tightness -depressed mood or other mood changes -hallucinations, loss of contact with reality -fast, irregular heartbeat -increased blood pressure -irritable -nervousness or restlessness -painful urination -palpitations -tremors -trouble sleeping -seizures -signs and symptoms of a stroke like changes in vision; confusion; trouble speaking or understanding; severe headaches; sudden numbness or weakness of the face, arm or leg; trouble walking; dizziness; loss of balance or coordination -unusually weak or tired -vomiting Side effects that usually do not require medical attention (report to your doctor or health care professional if they continue or are bothersome): -constipation or diarrhea -dry mouth -headache -nausea -stomach upset -sweating This list may not describe all possible side effects. Call your doctor for medical advice about side effects. You may report side effects to FDA at 1-800-FDA-1088. Where should I keep my medicine? Keep out of the reach of children. This medicine can be abused. Keep your medicine in a safe place to protect it from theft. Do not share this medicine with anyone. Selling or giving away this medicine is dangerous and against the law. This medicine may cause accidental overdose and death if taken by other adults, children, or pets. Mix any unused medicine with a substance like cat litter or coffee grounds. Then throw the medicine away in a sealed container like a  sealed bag or a coffee can with a lid. Do not use the medicine after the expiration date. Store at room temperature between 20 and 25 degrees C (68 and 77 degrees F). Keep container tightly closed. NOTE: This sheet is a summary. It may not cover all possible information. If you have questions about this medicine, talk to your doctor, pharmacist, or health care provider.  2019 Elsevier/Gold Standard (2017-02-13 08:23:13)  Health Maintenance, Female Adopting a healthy lifestyle and getting preventive care can go a long way to promote health and wellness. Talk with your health care provider about what schedule of regular examinations is right for you. This is a good chance for you to check in with your provider about disease prevention and staying healthy. In between checkups, there are plenty of things you can do on your own. Experts have done a lot of research about which lifestyle changes and preventive measures are most likely to keep you healthy. Ask your health care provider for more information. Weight and diet Eat a healthy diet  Be sure to include plenty of vegetables, fruits, low-fat dairy products, and lean protein.  Do not eat a lot of foods high in solid fats, added sugars, or salt.  Get regular exercise. This is one of the most important things you can do for your health. ? Most adults should exercise for at least 150 minutes each week. The exercise should increase your heart rate and make you sweat (moderate-intensity exercise). ? Most adults should also do strengthening exercises at least twice a  week. This is in addition to the moderate-intensity exercise. Maintain a healthy weight  Body mass index (BMI) is a measurement that can be used to identify possible weight problems. It estimates body fat based on height and weight. Your health care provider can help determine your BMI and help you achieve or maintain a healthy weight.  For females 22 years of age and older: ? A BMI  below 18.5 is considered underweight. ? A BMI of 18.5 to 24.9 is normal. ? A BMI of 25 to 29.9 is considered overweight. ? A BMI of 30 and above is considered obese. Watch levels of cholesterol and blood lipids  You should start having your blood tested for lipids and cholesterol at 51 years of age, then have this test every 5 years.  You may need to have your cholesterol levels checked more often if: ? Your lipid or cholesterol levels are high. ? You are older than 51 years of age. ? You are at high risk for heart disease. Cancer screening Lung Cancer  Lung cancer screening is recommended for adults 33-55 years old who are at high risk for lung cancer because of a history of smoking.  A yearly low-dose CT scan of the lungs is recommended for people who: ? Currently smoke. ? Have quit within the past 15 years. ? Have at least a 30-pack-year history of smoking. A pack year is smoking an average of one pack of cigarettes a day for 1 year.  Yearly screening should continue until it has been 15 years since you quit.  Yearly screening should stop if you develop a health problem that would prevent you from having lung cancer treatment. Breast Cancer  Practice breast self-awareness. This means understanding how your breasts normally appear and feel.  It also means doing regular breast self-exams. Let your health care provider know about any changes, no matter how small.  If you are in your 20s or 30s, you should have a clinical breast exam (CBE) by a health care provider every 1-3 years as part of a regular health exam.  If you are 82 or older, have a CBE every year. Also consider having a breast X-ray (mammogram) every year.  If you have a family history of breast cancer, talk to your health care provider about genetic screening.  If you are at high risk for breast cancer, talk to your health care provider about having an MRI and a mammogram every year.  Breast cancer gene (BRCA)  assessment is recommended for women who have family members with BRCA-related cancers. BRCA-related cancers include: ? Breast. ? Ovarian. ? Tubal. ? Peritoneal cancers.  Results of the assessment will determine the need for genetic counseling and BRCA1 and BRCA2 testing. Cervical Cancer Your health care provider may recommend that you be screened regularly for cancer of the pelvic organs (ovaries, uterus, and vagina). This screening involves a pelvic examination, including checking for microscopic changes to the surface of your cervix (Pap test). You may be encouraged to have this screening done every 3 years, beginning at age 62.  For women ages 58-65, health care providers may recommend pelvic exams and Pap testing every 3 years, or they may recommend the Pap and pelvic exam, combined with testing for human papilloma virus (HPV), every 5 years. Some types of HPV increase your risk of cervical cancer. Testing for HPV may also be done on women of any age with unclear Pap test results.  Other health care providers may not recommend  any screening for nonpregnant women who are considered low risk for pelvic cancer and who do not have symptoms. Ask your health care provider if a screening pelvic exam is right for you.  If you have had past treatment for cervical cancer or a condition that could lead to cancer, you need Pap tests and screening for cancer for at least 20 years after your treatment. If Pap tests have been discontinued, your risk factors (such as having a new sexual partner) need to be reassessed to determine if screening should resume. Some women have medical problems that increase the chance of getting cervical cancer. In these cases, your health care provider may recommend more frequent screening and Pap tests. Colorectal Cancer  This type of cancer can be detected and often prevented.  Routine colorectal cancer screening usually begins at 51 years of age and continues through 51 years  of age.  Your health care provider may recommend screening at an earlier age if you have risk factors for colon cancer.  Your health care provider may also recommend using home test kits to check for hidden blood in the stool.  A small camera at the end of a tube can be used to examine your colon directly (sigmoidoscopy or colonoscopy). This is done to check for the earliest forms of colorectal cancer.  Routine screening usually begins at age 87.  Direct examination of the colon should be repeated every 5-10 years through 51 years of age. However, you may need to be screened more often if early forms of precancerous polyps or small growths are found. Skin Cancer  Check your skin from head to toe regularly.  Tell your health care provider about any new moles or changes in moles, especially if there is a change in a mole's shape or color.  Also tell your health care provider if you have a mole that is larger than the size of a pencil eraser.  Always use sunscreen. Apply sunscreen liberally and repeatedly throughout the day.  Protect yourself by wearing long sleeves, pants, a wide-brimmed hat, and sunglasses whenever you are outside. Heart disease, diabetes, and high blood pressure  High blood pressure causes heart disease and increases the risk of stroke. High blood pressure is more likely to develop in: ? People who have blood pressure in the high end of the normal range (130-139/85-89 mm Hg). ? People who are overweight or obese. ? People who are African American.  If you are 62-75 years of age, have your blood pressure checked every 3-5 years. If you are 46 years of age or older, have your blood pressure checked every year. You should have your blood pressure measured twice-once when you are at a hospital or clinic, and once when you are not at a hospital or clinic. Record the average of the two measurements. To check your blood pressure when you are not at a hospital or clinic, you can  use: ? An automated blood pressure machine at a pharmacy. ? A home blood pressure monitor.  If you are between 20 years and 38 years old, ask your health care provider if you should take aspirin to prevent strokes.  Have regular diabetes screenings. This involves taking a blood sample to check your fasting blood sugar level. ? If you are at a normal weight and have a low risk for diabetes, have this test once every three years after 51 years of age. ? If you are overweight and have a high risk for diabetes, consider  being tested at a younger age or more often. Preventing infection Hepatitis B  If you have a higher risk for hepatitis B, you should be screened for this virus. You are considered at high risk for hepatitis B if: ? You were born in a country where hepatitis B is common. Ask your health care provider which countries are considered high risk. ? Your parents were born in a high-risk country, and you have not been immunized against hepatitis B (hepatitis B vaccine). ? You have HIV or AIDS. ? You use needles to inject street drugs. ? You live with someone who has hepatitis B. ? You have had sex with someone who has hepatitis B. ? You get hemodialysis treatment. ? You take certain medicines for conditions, including cancer, organ transplantation, and autoimmune conditions. Hepatitis C  Blood testing is recommended for: ? Everyone born from [birth year] through 1963. ? Anyone with known risk factors for hepatitis C. Sexually transmitted infections (STIs)  You should be screened for sexually transmitted infections (STIs) including gonorrhea and chlamydia if: ? You are sexually active and are younger than 51 years of age. ? You are older than 51 years of age and your health care provider tells you that you are at risk for this type of infection. ? Your sexual activity has changed since you were last screened and you are at an increased risk for chlamydia or gonorrhea. Ask your health care  provider if you are at risk.  If you do not have HIV, but are at risk, it may be recommended that you take a prescription medicine daily to prevent HIV infection. This is called pre-exposure prophylaxis (PrEP). You are considered at risk if: ? You are sexually active and do not regularly use condoms or know the HIV status of your partner(s). ? You take drugs by injection. ? You are sexually active with a partner who has HIV. Talk with your health care provider about whether you are at high risk of being infected with HIV. If you choose to begin PrEP, you should first be tested for HIV. You should then be tested every 3 months for as long as you are taking PrEP. Pregnancy  If you are premenopausal and you may become pregnant, ask your health care provider about preconception counseling.  If you may become pregnant, take 400 to 800 micrograms (mcg) of folic acid every day.  If you want to prevent pregnancy, talk to your health care provider about birth control (contraception). Osteoporosis and menopause  Osteoporosis is a disease in which the bones lose minerals and strength with aging. This can result in serious bone fractures. Your risk for osteoporosis can be identified using a bone density scan.  If you are 25 years of age or older, or if you are at risk for osteoporosis and fractures, ask your health care provider if you should be screened.  Ask your health care provider whether you should take a calcium or vitamin D supplement to lower your risk for osteoporosis.  Menopause may have certain physical symptoms and risks.  Hormone replacement therapy may reduce some of these symptoms and risks. Talk to your health care provider about whether hormone replacement therapy is right for you. Follow these instructions at home:  Schedule regular health, dental, and eye exams.  Stay current with your immunizations.  Do not use any tobacco products including cigarettes, chewing tobacco, or  electronic cigarettes.  If you are pregnant, do not drink alcohol.  If you are breastfeeding, limit  how much and how often you drink alcohol.  Limit alcohol intake to no more than 1 drink per day for nonpregnant women. One drink equals 12 ounces of beer, 5 ounces of wine, or 1 ounces of hard liquor.  Do not use street drugs.  Do not share needles.  Ask your health care provider for help if you need support or information about quitting drugs.  Tell your health care provider if you often feel depressed.  Tell your health care provider if you have ever been abused or do not feel safe at home. This information is not intended to replace advice given to you by your health care provider. Make sure you discuss any questions you have with your health care provider. Document Released: 03/17/2011 Document Revised: 02/07/2016 Document Reviewed: 06/05/2015 Elsevier Interactive Patient Education  2019 [employer].

## 2019-03-08 NOTE — Assessment & Plan Note (Signed)
Appetite suppressant requested but deferred as she has not committed to exercise and dieting yet.

## 2019-03-08 NOTE — Assessment & Plan Note (Signed)
Not at goal on amlodipine 5 mg dose.  Adding hctz  Dc lasix.

## 2019-03-08 NOTE — Progress Notes (Signed)
Patient ID: Bethany Fernandez, female    DOB: 08/13/1966  Age: 53 y.o. MRN: [id number]  The patient is here for annual preventive examination The risk factors are reflected in the social history.   Mammogram due May 14 2019 Colon done may 2019 PAP normal april 019   The roster of all physicians providing medical care to patient - is listed in the Snapshot section of the chart.  Activities of daily living:  The patient is 100% independent in all ADLs: dressing, toileting, feeding as well as independent mobility  Home safety : The patient has smoke detectors in the home. They wear seatbelts.  There are no firearms at home. There is no violence in the home.   There is no risks for hepatitis, STDs or HIV. There is no   history of blood transfusion. They have no travel history to infectious disease endemic areas of the world.  The patient has seen their dentist in the last six month. They have seen their eye doctor in the last year. They admit to slight hearing difficulty with regard to whispered voices and some television programs.  They have deferred audiologic testing in the last year.  They do not  have excessive sun exposure. Discussed the need for sun protection: hats, long sleeves and use of sunscreen if there is significant sun exposure.   Diet: the importance of a healthy diet is discussed. They do have a healthy diet.  The benefits of regular aerobic exercise were discussed. She walks 4 times per week ,  20 minutes.   Depression screen: there are no signs or vegative symptoms of depression- irritability, change in appetite, anhedonia, sadness/tearfullness.  Cognitive assessment: the patient manages all their financial and personal affairs and is actively engaged. They could relate day,date,year and events; recalled 2/3 objects at 3 minutes; performed clock-face test normally.  The following portions of the patient's history were reviewed and updated as appropriate: allergies, current  medications, past family history, past medical history,  past surgical history, past social history  and problem list.  Visual acuity was not assessed per patient preference since she has regular follow up with her ophthalmologist. Hearing and body mass index were assessed and reviewed.   During the course of the visit the patient was educated and counseled about appropriate screening and preventive services including : fall prevention , diabetes screening, nutrition counseling, colorectal cancer screening, and recommended immunizations.    CC: The primary encounter diagnosis was Essential hypertension. Diagnoses of Breast cancer screening, Class 1 obesity due to excess calories with body mass index (BMI) of 32.0 to 32.9 in adult, unspecified whether serious comorbidity present, Essential hypertension, benign, and Encounter for preventive health examination were also pertinent to this visit.  WEIGH GAIN, NOT EXERCISING,  DISCUSSED PIROR WT LOSS SUCCESS  GOAL 165 LBS  DISCUSSED PHENTERMINE USE IF NO LOSS IN3 WEEKS History Bethany Fernandez has a past medical history of Chicken pox, Fibrocystic breast (2013), Hyperlipidemia, Hypertension (1999), and Migraines.   She has a past surgical history that includes Partial hysterectomy (2012); Breast biopsy (2013); Abdominal hysterectomy; Ectopic pregnancy surgery; and Colonoscopy with propofol (N/A, 02/03/2018).   Her family history includes Alcohol abuse in her maternal grandfather; Breast cancer (age of onset: 1650) in her cousin; Diabetes in her father; Heart disease (age of onset: 3257) in her maternal aunt; Hyperlipidemia in her father and mother; Hypertension in her father and mother; Kidney disease in her maternal uncle; Lung disease (age of onset: 1969) in her maternal  grandfather; Stroke in her paternal grandfather.She reports that she has never smoked. She has never used smokeless tobacco. She reports current alcohol use. She reports that she does not use  drugs.  Outpatient Medications Prior to Visit  Medication Sig Dispense Refill  . amLODipine (NORVASC) 5 MG tablet TAKE 1 TABLET BY MOUTH EVERY DAY 90 tablet 1  . atorvastatin (LIPITOR) 20 MG tablet TAKE 1 TABLET (20 MG TOTAL) BY MOUTH DAILY. PLEASE CALL OFFICE FOR APPT WITH PCP FOR FURTHER REFILLS 90 tablet 1  . BLACK COHOSH EXTRACT PO Take 1 tablet by mouth daily.    . calcium-vitamin D (OSCAL WITH D) 250-125 MG-UNIT per tablet Take 1 tablet by mouth daily.    . Multiple Vitamin (MULTI VITAMIN DAILY PO) Take by mouth.    . TURMERIC PO Take 1 tablet by mouth daily.    . furosemide (LASIX) 20 MG tablet TAKE 1 TABLET BY MOUTH EVERY DAY AS NEEDED FOR FLUID 90 tablet 0   No facility-administered medications prior to visit.     Review of Systems   Patient denies headache, fevers, malaise, unintentional weight loss, skin rash, eye pain, sinus congestion and sinus pain, sore throat, dysphagia,  hemoptysis , cough, dyspnea, wheezing, chest pain, palpitations, orthopnea, edema, abdominal pain, nausea, melena, diarrhea, constipation, flank pain, dysuria, hematuria, urinary  Frequency, nocturia, numbness, tingling, seizures,  Focal weakness, Loss of consciousness,  Tremor, insomnia, depression, anxiety, and suicidal ideation.     Objective:  BP 140/88 (BP Location: Right Arm, Patient Position: Sitting, Cuff Size: Normal)   Pulse 82   Temp 99.4 F (37.4 C) (Oral)   Resp 16   Ht 5\' 3"  (1.6 m)   Wt 176 lb (79.8 kg)   SpO2 99%   BMI 31.18 kg/m   Physical Exam  General appearance: alert, cooperative and appears stated age Head: Normocephalic, without obvious abnormality, atraumatic Eyes: conjunctivae/corneas clear. PERRL, EOM's intact. Fundi benign. Ears: normal TM's and external ear canals both ears Nose: Nares normal. Septum midline. Mucosa normal. No drainage or sinus tenderness. Throat: lips, mucosa, and tongue normal; teeth and gums normal Neck: no adenopathy, no carotid bruit, no JVD,  supple, symmetrical, trachea midline and thyroid not enlarged, symmetric, no tenderness/mass/nodules Lungs: clear to auscultation bilaterally Breasts: normal appearance, no masses or tenderness Heart: regular rate and rhythm, S1, S2 normal, no murmur, click, rub or gallop Abdomen: soft, non-tender; bowel sounds normal; no masses,  no organomegaly Extremities: extremities normal, atraumatic, no cyanosis or edema Pulses: 2+ and symmetric Skin: Skin color, texture, turgor normal. No rashes or lesions Neurologic: Alert and oriented X 3, normal strength and tone. Normal symmetric reflexes. Normal coordination and gait.    Assessment & Plan:   Problem List Items Addressed This Visit    Encounter for preventive health examination    age appropriate education and counseling updated, referrals for preventative services and immunizations addressed, dietary and smoking counseling addressed, most recent labs reviewed.  I have personally reviewed and have noted:  1) the patient's medical and social history 2) The pt's use of alcohol, tobacco, and illicit drugs 3) The patient's current medications and supplements 4) Functional ability including ADL's, fall risk, home safety risk, hearing and visual impairment 5) Diet and physical activities 6) Evidence for depression or mood disorder 7) The patient's height, weight, and BMI have been recorded in the chart  I have made referrals, and provided counseling and education based on review of the above      Essential hypertension,  benign    Not at goal on amlodipine 5 mg dose.  Adding hctz  Dc lasix.       Relevant Medications   hydrochlorothiazide (HYDRODIURIL) 25 MG tablet   Obesity    Appetite suppressant requested but deferred as she has not committed to exercise and dieting yet.        Other Visit Diagnoses    Essential hypertension    -  Primary   Relevant Medications   hydrochlorothiazide (HYDRODIURIL) 25 MG tablet   Other Relevant  Orders   Comprehensive metabolic panel   Urine Microalbumin w/creat. ratio   Breast cancer screening       Relevant Orders   MM 3D SCREEN BREAST BILATERAL      I have discontinued Denali R. Wurzel's furosemide. I am also having her start on hydrochlorothiazide. Additionally, I am having her maintain her calcium-vitamin D, Multiple Vitamin (MULTI VITAMIN DAILY PO), amLODipine, atorvastatin, TURMERIC PO, and BLACK COHOSH EXTRACT PO.  Meds ordered this encounter  Medications  . hydrochlorothiazide (HYDRODIURIL) 25 MG tablet    Sig: Take 1 tablet (25 mg total) by mouth daily.    Dispense:  90 tablet    Refill:  3    Medications Discontinued During This Encounter  Medication Reason  . furosemide (LASIX) 20 MG tablet     Follow-up: No follow-ups on file.   Crecencio Mc, MD

## 2019-03-09 ENCOUNTER — Other Ambulatory Visit: Payer: BC Managed Care – PPO

## 2019-03-09 ENCOUNTER — Other Ambulatory Visit: Payer: Self-pay

## 2019-03-09 DIAGNOSIS — I10 Essential (primary) hypertension: Secondary | ICD-10-CM

## 2019-03-09 LAB — COMPREHENSIVE METABOLIC PANEL
ALT: 25 U/L (ref 0–35)
AST: 24 U/L (ref 0–37)
Albumin: 4.3 g/dL (ref 3.5–5.2)
Alkaline Phosphatase: 71 U/L (ref 39–117)
BUN: 14 mg/dL (ref 6–23)
CO2: 30 mEq/L (ref 19–32)
Calcium: 9 mg/dL (ref 8.4–10.5)
Chloride: 103 mEq/L (ref 96–112)
Creatinine, Ser: 0.94 mg/dL (ref 0.40–1.20)
GFR: 75.9 mL/min (ref >60.00)
Glucose, Bld: 75 mg/dL (ref 70–99)
Potassium: 3.6 mEq/L (ref 3.5–5.1)
Sodium: 139 mEq/L (ref 135–145)
Total Bilirubin: 0.4 mg/dL (ref 0.2–1.2)
Total Protein: 7.5 g/dL (ref 6.0–8.3)

## 2019-03-09 LAB — MICROALBUMIN / CREATININE URINE RATIO
Creatinine,U: 51.9 mg/dL
Microalb Creat Ratio: 1.3 mg/g (ref 0.0–30.0)
Microalb, Ur: <0.7 mg/dL (ref 0.0–1.9)

## 2019-05-02 ENCOUNTER — Other Ambulatory Visit: Payer: Self-pay | Admitting: Internal Medicine

## 2019-05-16 DIAGNOSIS — Z1239 Encounter for other screening for malignant neoplasm of breast: Secondary | ICD-10-CM | POA: Diagnosis not present

## 2019-05-16 DIAGNOSIS — Z1231 Encounter for screening mammogram for malignant neoplasm of breast: Secondary | ICD-10-CM | POA: Diagnosis not present

## 2019-05-16 LAB — HM MAMMOGRAPHY

## 2019-06-01 DIAGNOSIS — N6489 Other specified disorders of breast: Secondary | ICD-10-CM | POA: Diagnosis not present

## 2019-06-01 DIAGNOSIS — R922 Inconclusive mammogram: Secondary | ICD-10-CM | POA: Diagnosis not present

## 2019-06-01 DIAGNOSIS — R928 Other abnormal and inconclusive findings on diagnostic imaging of breast: Secondary | ICD-10-CM | POA: Diagnosis not present

## 2019-08-16 DIAGNOSIS — H43392 Other vitreous opacities, left eye: Secondary | ICD-10-CM | POA: Diagnosis not present

## 2019-08-30 ENCOUNTER — Other Ambulatory Visit: Payer: Self-pay | Admitting: Internal Medicine

## 2019-10-04 DIAGNOSIS — Z20828 Contact with and (suspected) exposure to other viral communicable diseases: Secondary | ICD-10-CM | POA: Diagnosis not present

## 2019-11-24 ENCOUNTER — Other Ambulatory Visit: Payer: Self-pay | Admitting: Internal Medicine

## 2019-12-20 DIAGNOSIS — M9903 Segmental and somatic dysfunction of lumbar region: Secondary | ICD-10-CM | POA: Diagnosis not present

## 2019-12-20 DIAGNOSIS — M9902 Segmental and somatic dysfunction of thoracic region: Secondary | ICD-10-CM | POA: Diagnosis not present

## 2019-12-20 DIAGNOSIS — M9901 Segmental and somatic dysfunction of cervical region: Secondary | ICD-10-CM | POA: Diagnosis not present

## 2019-12-20 DIAGNOSIS — M542 Cervicalgia: Secondary | ICD-10-CM | POA: Diagnosis not present

## 2019-12-27 DIAGNOSIS — M9903 Segmental and somatic dysfunction of lumbar region: Secondary | ICD-10-CM | POA: Diagnosis not present

## 2019-12-27 DIAGNOSIS — M9902 Segmental and somatic dysfunction of thoracic region: Secondary | ICD-10-CM | POA: Diagnosis not present

## 2019-12-27 DIAGNOSIS — M542 Cervicalgia: Secondary | ICD-10-CM | POA: Diagnosis not present

## 2019-12-27 DIAGNOSIS — M9901 Segmental and somatic dysfunction of cervical region: Secondary | ICD-10-CM | POA: Diagnosis not present

## 2020-01-03 DIAGNOSIS — M9902 Segmental and somatic dysfunction of thoracic region: Secondary | ICD-10-CM | POA: Diagnosis not present

## 2020-01-03 DIAGNOSIS — M9901 Segmental and somatic dysfunction of cervical region: Secondary | ICD-10-CM | POA: Diagnosis not present

## 2020-01-03 DIAGNOSIS — M9903 Segmental and somatic dysfunction of lumbar region: Secondary | ICD-10-CM | POA: Diagnosis not present

## 2020-01-03 DIAGNOSIS — M542 Cervicalgia: Secondary | ICD-10-CM | POA: Diagnosis not present

## 2020-01-17 DIAGNOSIS — M9903 Segmental and somatic dysfunction of lumbar region: Secondary | ICD-10-CM | POA: Diagnosis not present

## 2020-01-17 DIAGNOSIS — M542 Cervicalgia: Secondary | ICD-10-CM | POA: Diagnosis not present

## 2020-01-17 DIAGNOSIS — M9902 Segmental and somatic dysfunction of thoracic region: Secondary | ICD-10-CM | POA: Diagnosis not present

## 2020-01-17 DIAGNOSIS — M9901 Segmental and somatic dysfunction of cervical region: Secondary | ICD-10-CM | POA: Diagnosis not present

## 2020-01-31 DIAGNOSIS — M9901 Segmental and somatic dysfunction of cervical region: Secondary | ICD-10-CM | POA: Diagnosis not present

## 2020-01-31 DIAGNOSIS — M542 Cervicalgia: Secondary | ICD-10-CM | POA: Diagnosis not present

## 2020-01-31 DIAGNOSIS — M9903 Segmental and somatic dysfunction of lumbar region: Secondary | ICD-10-CM | POA: Diagnosis not present

## 2020-01-31 DIAGNOSIS — M9902 Segmental and somatic dysfunction of thoracic region: Secondary | ICD-10-CM | POA: Diagnosis not present

## 2020-02-28 DIAGNOSIS — M9903 Segmental and somatic dysfunction of lumbar region: Secondary | ICD-10-CM | POA: Diagnosis not present

## 2020-02-28 DIAGNOSIS — M9901 Segmental and somatic dysfunction of cervical region: Secondary | ICD-10-CM | POA: Diagnosis not present

## 2020-02-28 DIAGNOSIS — M542 Cervicalgia: Secondary | ICD-10-CM | POA: Diagnosis not present

## 2020-02-28 DIAGNOSIS — M9902 Segmental and somatic dysfunction of thoracic region: Secondary | ICD-10-CM | POA: Diagnosis not present

## 2020-03-09 ENCOUNTER — Encounter: Payer: Self-pay | Admitting: Internal Medicine

## 2020-03-09 ENCOUNTER — Other Ambulatory Visit: Payer: Self-pay

## 2020-03-09 ENCOUNTER — Ambulatory Visit (INDEPENDENT_AMBULATORY_CARE_PROVIDER_SITE_OTHER): Payer: BC Managed Care – PPO | Admitting: Internal Medicine

## 2020-03-09 VITALS — BP 136/88 | HR 75 | Temp 97.8°F | Resp 16 | Ht 63.0 in | Wt 181.4 lb

## 2020-03-09 DIAGNOSIS — R7303 Prediabetes: Secondary | ICD-10-CM

## 2020-03-09 DIAGNOSIS — R635 Abnormal weight gain: Secondary | ICD-10-CM

## 2020-03-09 DIAGNOSIS — E7849 Other hyperlipidemia: Secondary | ICD-10-CM | POA: Diagnosis not present

## 2020-03-09 DIAGNOSIS — I10 Essential (primary) hypertension: Secondary | ICD-10-CM

## 2020-03-09 DIAGNOSIS — E6609 Other obesity due to excess calories: Secondary | ICD-10-CM

## 2020-03-09 DIAGNOSIS — Z0000 Encounter for general adult medical examination without abnormal findings: Secondary | ICD-10-CM

## 2020-03-09 DIAGNOSIS — Z1231 Encounter for screening mammogram for malignant neoplasm of breast: Secondary | ICD-10-CM | POA: Diagnosis not present

## 2020-03-09 DIAGNOSIS — F4321 Adjustment disorder with depressed mood: Secondary | ICD-10-CM

## 2020-03-09 DIAGNOSIS — Z6832 Body mass index (BMI) 32.0-32.9, adult: Secondary | ICD-10-CM

## 2020-03-09 DIAGNOSIS — R5383 Other fatigue: Secondary | ICD-10-CM

## 2020-03-09 NOTE — Progress Notes (Signed)
Patient ID: Bethany Fernandez, female    DOB: 1966-04-06  Age: 54 y.o. MRN: [id number]  The patient is here for annual preventive examination and management of other chronic and acute problems.  This visit occurred during the SARS-CoV-2 public health emergency.  Safety protocols were in place, including screening questions prior to the visit, additional usage of staff PPE, and extensive cleaning of exam room while observing appropriate contact time as indicated for disinfecting solutions.     The risk factors are reflected in the social history.  The roster of all physicians providing medical care to patient - is listed in the Snapshot section of the chart.  Activities of daily living:  The patient is 100% independent in all ADLs: dressing, toileting, feeding as well as independent mobility  Home safety : The patient has smoke detectors in the home. They wear seatbelts.  There are no firearms at home. There is no violence in the home.   There is no risks for hepatitis, STDs or HIV. There is no   history of blood transfusion. They have no travel history to infectious disease endemic areas of the world.  The patient has seen their dentist in the last six month. They have seen their eye doctor in the last year. She denies hearing difficulty .  She does not  have excessive sun exposure. Discussed the need for sun protection: hats, long sleeves and use of sunscreen if there is significant sun exposure.   Diet: the importance of a healthy diet is discussed. They do have a healthy diet.  The benefits of regular aerobic exercise were discussed. She had not been exercising but has resumed walking 3 times per week ,  30 minutes.   Depression screen: there are no signs or vegative symptoms of depression, but she continues to endorse grief following the loss of her father to COVID 46 in 10-26-22 - irritability and  sadness/tearfullness.   The following portions of the patient's history were reviewed and  updated as appropriate: allergies, current medications, past family history, past medical history,  past surgical history, past social history  and problem list.  Visual acuity was not assessed per patient preference since she has regular follow up with her ophthalmologist. Hearing and body mass index were assessed and reviewed.   During the course of the visit the patient was educated and counseled about appropriate screening and preventive services including : fall prevention , diabetes screening, nutrition counseling, colorectal cancer screening, and recommended immunizations.    CC: The primary encounter diagnosis was Breast cancer screening by mammogram. Diagnoses of Prediabetes, Familial hyperlipidemia, high LDL, Essential hypertension, benign, Weight gain, abnormal, Fatigue, unspecified type, Class 1 obesity due to excess calories with body mass index (BMI) of 32.0 to 32.9 in adult, unspecified whether serious comorbidity present, Encounter for preventive health examination, and Grief were also pertinent to this visit.  1) Stressful year,  resulting in weight gain of 22 lbs. Father died in 2022/10/26 of COVID 27-Jan-2023. Still angry at him for being careless with his health.    2) Insomnia:  Sleep patterns are off .  Waking up early    History Bethany Fernandez has a past medical history of Chicken pox, Fibrocystic breast (2013), Hyperlipidemia, Hypertension (1999), and Migraines.   She has a past surgical history that includes Partial hysterectomy (2012); Breast biopsy (2013); Abdominal hysterectomy; Ectopic pregnancy surgery; and Colonoscopy with propofol (N/A, 02/03/2018).   Her family history includes Alcohol abuse in her maternal grandfather; Breast cancer (age  of onset: 70) in her cousin; Diabetes in her father; Heart disease (age of onset: 58) in her maternal aunt; Hyperlipidemia in her father and mother; Hypertension in her father and mother; Kidney disease in her maternal uncle; Lung disease (age of onset:  37) in her maternal grandfather; Stroke in her paternal grandfather.She reports that she has never smoked. She has never used smokeless tobacco. She reports current alcohol use. She reports that she does not use drugs.  Outpatient Medications Prior to Visit  Medication Sig Dispense Refill  . amLODipine (NORVASC) 5 MG tablet TAKE 1 TABLET BY MOUTH EVERY DAY 90 tablet 1  . atorvastatin (LIPITOR) 20 MG tablet TAKE 1 TABLET (20 MG TOTAL) BY MOUTH DAILY. PLEASE CALL OFFICE FOR APPT WITH PCP FOR FURTHER REFILLS 90 tablet 1  . calcium-vitamin D (OSCAL WITH D) 250-125 MG-UNIT per tablet Take 1 tablet by mouth daily.    . hydrochlorothiazide (HYDRODIURIL) 25 MG tablet Take 1 tablet (25 mg total) by mouth daily. 90 tablet 3  . Multiple Vitamin (MULTI VITAMIN DAILY PO) Take by mouth.    . TURMERIC PO Take 1 tablet by mouth daily.    Marland Kitchen BLACK COHOSH EXTRACT PO Take 1 tablet by mouth daily. (Patient not taking: Reported on 03/09/2020)     No facility-administered medications prior to visit.    Review of Systems    Patient denies headache, fevers, malaise, unintentional weight loss, skin rash, eye pain, sinus congestion and sinus pain, sore throat, dysphagia,  hemoptysis , cough, dyspnea, wheezing, chest pain, palpitations, orthopnea, edema, abdominal pain, nausea, melena, diarrhea, constipation, flank pain, dysuria, hematuria, urinary  Frequency, nocturia, numbness, tingling, seizures,  Focal weakness, Loss of consciousness,  Tremor, insomnia, depression, anxiety, and suicidal ideation.      Objective:  BP 136/88 (BP Location: Left Arm, Patient Position: Sitting, Cuff Size: Normal)   Pulse 75   Temp 97.8 F (36.6 C) (Temporal)   Resp 16   Ht 5\' 3"  (1.6 m)   Wt 181 lb 6.4 oz (82.3 kg)   SpO2 99%   BMI 32.13 kg/m   Physical Exam  General appearance: alert, cooperative and appears stated age Head: Normocephalic, without obvious abnormality, atraumatic Eyes: conjunctivae/corneas clear. PERRL,  EOM's intact. Fundi benign. Ears: normal TM's and external ear canals both ears Nose: Nares normal. Septum midline. Mucosa normal. No drainage or sinus tenderness. Throat: lips, mucosa, and tongue normal; teeth and gums normal Neck: no adenopathy, no carotid bruit, no JVD, supple, symmetrical, trachea midline and thyroid not enlarged, symmetric, no tenderness/mass/nodules Lungs: clear to auscultation bilaterally Breasts: normal appearance, no masses or tenderness Heart: regular rate and rhythm, S1, S2 normal, no murmur, click, rub or gallop Abdomen: soft, non-tender; bowel sounds normal; no masses,  no organomegaly Extremities: extremities normal, atraumatic, no cyanosis or edema Pulses: 2+ and symmetric Skin: Skin color, texture, turgor normal. No rashes or lesions Neurologic: Alert and oriented X 3, normal strength and tone. Normal symmetric reflexes. Normal coordination and gait.    Assessment & Plan:   Problem List Items Addressed This Visit      Unprioritized   RESOLVED: Weight gain, abnormal   Relevant Orders   TSH   Obesity    She has gained 22 lbs ober the last year due in part to grief over her father's death. I have addressed  BMI and recommended a low glycemic index diet utilizing smaller more frequent meals to increase metabolism.  I have also recommended that patient start exercising with a  goal of 30 minutes of aerobic exercise a minimum of 5 days per week. Screening for lipid disorders, thyroid and diabetes to be done.       Grief    Patient is dealing with the unexpected loss of father and has adequate coping skills and emotional support .  i have asked patient to return in one month to examine for signs of unresolving grief.       Familial hyperlipidemia, high LDL    Tolerating atorvastatin.  Repeat lipids are due       Relevant Orders   Lipid panel   Essential hypertension, benign    Well controlled on current regimen. Renal function due, no changes today.       Relevant Orders   Microalbumin / creatinine urine ratio   Encounter for preventive health examination    age appropriate education and counseling updated, referrals for preventative services and immunizations addressed, dietary and smoking counseling addressed, most recent labs reviewed.  I have personally reviewed and have noted:  1) the patient's medical and social history 2) The pt's use of alcohol, tobacco, and illicit drugs 3) The patient's current medications and supplements 4) Functional ability including ADL's, fall risk, home safety risk, hearing and visual impairment 5) Diet and physical activities 6) Evidence for depression or mood disorder 7) The patient's height, weight, and BMI have been recorded in the chart  I have made referrals, and provided counseling and education based on review of the above       Other Visit Diagnoses    Breast cancer screening by mammogram    -  Primary   Relevant Orders   MM DIGITAL SCREENING BILATERAL   Prediabetes       Relevant Orders   Hemoglobin A1c   Fatigue, unspecified type       Relevant Orders   Comprehensive metabolic panel   CBC with Differential/Platelet      I have discontinued Madden R. Pedley's BLACK COHOSH EXTRACT PO. I am also having her maintain her calcium-vitamin D, Multiple Vitamin (MULTI VITAMIN DAILY PO), TURMERIC PO, hydrochlorothiazide, atorvastatin, and amLODipine.  No orders of the defined types were placed in this encounter.   Medications Discontinued During This Encounter  Medication Reason  . BLACK COHOSH EXTRACT PO Completed Course    Follow-up: No follow-ups on file.   Crecencio Mc, MD

## 2020-03-09 NOTE — Patient Instructions (Addendum)
Try taking up to 10 mg melatonin at dinnertime to help restore normal sleep/wake cycles   You are still grieving.  Use your walk to renew your mind   Your annual mammogram has been ordered and is due on or after September 16      Return for fasting labs at your leisure   Health Maintenance for Postmenopausal Women Menopause is a normal process in which your ability to get pregnant comes to an end. This process happens slowly over many months or years, usually between the ages of 34 and 41. Menopause is complete when you have missed your menstrual periods for 12 months. It is important to talk with your health care provider about some of the most common conditions that affect women after menopause (postmenopausal women). These include heart disease, cancer, and bone loss (osteoporosis). Adopting a healthy lifestyle and getting preventive care can help to promote your health and wellness. The actions you take can also lower your chances of developing some of these common conditions. What should I know about menopause? During menopause, you may get a number of symptoms, such as:  Hot flashes. These can be moderate or severe.  Night sweats.  Decrease in sex drive.  Mood swings.  Headaches.  Tiredness.  Irritability.  Memory problems.  Insomnia. Choosing to treat or not to treat these symptoms is a decision that you make with your health care provider. Do I need hormone replacement therapy?  Hormone replacement therapy is effective in treating symptoms that are caused by menopause, such as hot flashes and night sweats.  Hormone replacement carries certain risks, especially as you become older. If you are thinking about using estrogen or estrogen with progestin, discuss the benefits and risks with your health care provider. What is my risk for heart disease and stroke? The risk of heart disease, heart attack, and stroke increases as you age. One of the causes may be a change in the  body's hormones during menopause. This can affect how your body uses dietary fats, triglycerides, and cholesterol. Heart attack and stroke are medical emergencies. There are many things that you can do to help prevent heart disease and stroke. Watch your blood pressure  High blood pressure causes heart disease and increases the risk of stroke. This is more likely to develop in people who have high blood pressure readings, are of African descent, or are overweight.  Have your blood pressure checked: ? Every 3-5 years if you are 61-50 years of age. ? Every year if you are 24 years old or older. Eat a healthy diet   Eat a diet that includes plenty of vegetables, fruits, low-fat dairy products, and lean protein.  Do not eat a lot of foods that are high in solid fats, added sugars, or sodium. Get regular exercise Get regular exercise. This is one of the most important things you can do for your health. Most adults should:  Try to exercise for at least 150 minutes each week. The exercise should increase your heart rate and make you sweat (moderate-intensity exercise).  Try to do strengthening exercises at least twice each week. Do these in addition to the moderate-intensity exercise.  Spend less time sitting. Even light physical activity can be beneficial. Other tips  Work with your health care provider to achieve or maintain a healthy weight.  Do not use any products that contain nicotine or tobacco, such as cigarettes, e-cigarettes, and chewing tobacco. If you need help quitting, ask your health care provider.  Know your numbers. Ask your health care provider to check your cholesterol and your blood sugar (glucose). Continue to have your blood tested as directed by your health care provider. Do I need screening for cancer? Depending on your health history and family history, you may need to have cancer screening at different stages of your life. This may include screening for:  Breast  cancer.  Cervical cancer.  Lung cancer.  Colorectal cancer. What is my risk for osteoporosis? After menopause, you may be at increased risk for osteoporosis. Osteoporosis is a condition in which bone destruction happens more quickly than new bone creation. To help prevent osteoporosis or the bone fractures that can happen because of osteoporosis, you may take the following actions:  If you are 78-55 years old, get at least 1,000 mg of calcium and at least 600 mg of vitamin D per day.  If you are older than age 42 but younger than age 5, get at least 1,200 mg of calcium and at least 600 mg of vitamin D per day.  If you are older than age 43, get at least 1,200 mg of calcium and at least 800 mg of vitamin D per day. Smoking and drinking excessive alcohol increase the risk of osteoporosis. Eat foods that are rich in calcium and vitamin D, and do weight-bearing exercises several times each week as directed by your health care provider. How does menopause affect my mental health? Depression may occur at any age, but it is more common as you become older. Common symptoms of depression include:  Low or sad mood.  Changes in sleep patterns.  Changes in appetite or eating patterns.  Feeling an overall lack of motivation or enjoyment of activities that you previously enjoyed.  Frequent crying spells. Talk with your health care provider if you think that you are experiencing depression. General instructions See your health care provider for regular wellness exams and vaccines. This may include:  Scheduling regular health, dental, and eye exams.  Getting and maintaining your vaccines. These include: ? Influenza vaccine. Get this vaccine each year before the flu season begins. ? Pneumonia vaccine. ? Shingles vaccine. ? Tetanus, diphtheria, and pertussis (Tdap) booster vaccine. Your health care provider may also recommend other immunizations. Tell your health care provider if you have ever  been abused or do not feel safe at home. Summary  Menopause is a normal process in which your ability to get pregnant comes to an end.  This condition causes hot flashes, night sweats, decreased interest in sex, mood swings, headaches, or lack of sleep.  Treatment for this condition may include hormone replacement therapy.  Take actions to keep yourself healthy, including exercising regularly, eating a healthy diet, watching your weight, and checking your blood pressure and blood sugar levels.  Get screened for cancer and depression. Make sure that you are up to date with all your vaccines. This information is not intended to replace advice given to you by your health care provider. Make sure you discuss any questions you have with your health care provider. Document Revised: 08/25/2018 Document Reviewed: 08/25/2018 Elsevier Patient Education  2020 [employer].

## 2020-03-10 DIAGNOSIS — F4321 Adjustment disorder with depressed mood: Secondary | ICD-10-CM | POA: Insufficient documentation

## 2020-03-10 NOTE — Assessment & Plan Note (Signed)
Well controlled on current regimen. Renal function due.,   no changes today. 

## 2020-03-10 NOTE — Assessment & Plan Note (Signed)
Tolerating atorvastatin.  Repeat lipids are due

## 2020-03-10 NOTE — Assessment & Plan Note (Signed)
She has gained 22 lbs ober the last year due in part to grief over her father's death. I have addressed  BMI and recommended a low glycemic index diet utilizing smaller more frequent meals to increase metabolism.  I have also recommended that patient start exercising with a goal of 30 minutes of aerobic exercise a minimum of 5 days per week. Screening for lipid disorders, thyroid and diabetes to be done.

## 2020-03-10 NOTE — Assessment & Plan Note (Signed)

## 2020-03-10 NOTE — Assessment & Plan Note (Signed)
Patient is dealing with the unexpected loss of father and has adequate coping skills and emotional support .  i have asked patient to return in one month to examine for signs of unresolving grief.

## 2020-03-12 ENCOUNTER — Other Ambulatory Visit (INDEPENDENT_AMBULATORY_CARE_PROVIDER_SITE_OTHER): Payer: BC Managed Care – PPO

## 2020-03-12 ENCOUNTER — Other Ambulatory Visit: Payer: Self-pay

## 2020-03-12 DIAGNOSIS — R635 Abnormal weight gain: Secondary | ICD-10-CM | POA: Diagnosis not present

## 2020-03-12 DIAGNOSIS — I10 Essential (primary) hypertension: Secondary | ICD-10-CM

## 2020-03-12 DIAGNOSIS — R7303 Prediabetes: Secondary | ICD-10-CM | POA: Diagnosis not present

## 2020-03-12 DIAGNOSIS — E7849 Other hyperlipidemia: Secondary | ICD-10-CM

## 2020-03-12 DIAGNOSIS — R5383 Other fatigue: Secondary | ICD-10-CM | POA: Diagnosis not present

## 2020-03-12 LAB — CBC WITH DIFFERENTIAL/PLATELET
Basophils Absolute: 0 10*3/uL (ref 0.0–0.1)
Basophils Relative: 0.3 % (ref 0.0–3.0)
Eosinophils Absolute: 0.1 10*3/uL (ref 0.0–0.7)
Eosinophils Relative: 1.2 % (ref 0.0–5.0)
HCT: 38 % (ref 36.0–46.0)
Hemoglobin: 12.6 g/dL (ref 12.0–15.0)
Lymphocytes Relative: 38.7 % (ref 12.0–46.0)
Lymphs Abs: 2.2 10*3/uL (ref 0.7–4.0)
MCHC: 33.3 g/dL (ref 30.0–36.0)
MCV: 88.6 fl (ref 78.0–100.0)
Monocytes Absolute: 0.5 10*3/uL (ref 0.1–1.0)
Monocytes Relative: 7.9 % (ref 3.0–12.0)
Neutro Abs: 3 10*3/uL (ref 1.4–7.7)
Neutrophils Relative %: 51.9 % (ref 43.0–77.0)
Platelets: 251 10*3/uL (ref 150.0–400.0)
RBC: 4.29 Mil/uL (ref 3.87–5.11)
RDW: 12.9 % (ref 11.5–15.5)
WBC: 5.8 10*3/uL (ref 4.0–10.5)

## 2020-03-12 LAB — COMPREHENSIVE METABOLIC PANEL
ALT: 16 U/L (ref 0–35)
AST: 17 U/L (ref 0–37)
Albumin: 4.1 g/dL (ref 3.5–5.2)
Alkaline Phosphatase: 81 U/L (ref 39–117)
BUN: 14 mg/dL (ref 6–23)
CO2: 32 mEq/L (ref 19–32)
Calcium: 9.2 mg/dL (ref 8.4–10.5)
Chloride: 102 mEq/L (ref 96–112)
Creatinine, Ser: 0.95 mg/dL (ref 0.40–1.20)
GFR: 74.68 mL/min (ref >60.00)
Glucose, Bld: 103 mg/dL — ABNORMAL HIGH (ref 70–99)
Potassium: 3.6 mEq/L (ref 3.5–5.1)
Sodium: 141 mEq/L (ref 135–145)
Total Bilirubin: 0.5 mg/dL (ref 0.2–1.2)
Total Protein: 6.7 g/dL (ref 6.0–8.3)

## 2020-03-12 LAB — LIPID PANEL
Cholesterol: 174 mg/dL (ref 0–200)
HDL: 52.7 mg/dL (ref >39.00)
LDL Cholesterol: 109 mg/dL — ABNORMAL HIGH (ref 0–99)
NonHDL: 121.29
Total CHOL/HDL Ratio: 3
Triglycerides: 63 mg/dL (ref 0.0–149.0)
VLDL: 12.6 mg/dL (ref 0.0–40.0)

## 2020-03-12 LAB — TSH: TSH: 0.44 u[IU]/mL (ref 0.35–4.50)

## 2020-03-12 LAB — MICROALBUMIN / CREATININE URINE RATIO
Creatinine,U: 376.8 mg/dL
Microalb Creat Ratio: 0.3 mg/g (ref 0.0–30.0)
Microalb, Ur: 1.2 mg/dL (ref 0.0–1.9)

## 2020-03-12 LAB — HEMOGLOBIN A1C: Hgb A1c MFr Bld: 6 % (ref 4.6–6.5)

## 2020-03-27 DIAGNOSIS — M9901 Segmental and somatic dysfunction of cervical region: Secondary | ICD-10-CM | POA: Diagnosis not present

## 2020-03-27 DIAGNOSIS — M542 Cervicalgia: Secondary | ICD-10-CM | POA: Diagnosis not present

## 2020-03-27 DIAGNOSIS — M9902 Segmental and somatic dysfunction of thoracic region: Secondary | ICD-10-CM | POA: Diagnosis not present

## 2020-03-27 DIAGNOSIS — M9903 Segmental and somatic dysfunction of lumbar region: Secondary | ICD-10-CM | POA: Diagnosis not present

## 2020-04-20 ENCOUNTER — Other Ambulatory Visit: Payer: Self-pay | Admitting: Internal Medicine

## 2020-07-31 DIAGNOSIS — M9901 Segmental and somatic dysfunction of cervical region: Secondary | ICD-10-CM | POA: Diagnosis not present

## 2020-07-31 DIAGNOSIS — M9903 Segmental and somatic dysfunction of lumbar region: Secondary | ICD-10-CM | POA: Diagnosis not present

## 2020-07-31 DIAGNOSIS — M9902 Segmental and somatic dysfunction of thoracic region: Secondary | ICD-10-CM | POA: Diagnosis not present

## 2020-09-17 DIAGNOSIS — Z03818 Encounter for observation for suspected exposure to other biological agents ruled out: Secondary | ICD-10-CM | POA: Diagnosis not present

## 2020-10-28 ENCOUNTER — Other Ambulatory Visit: Payer: Self-pay | Admitting: Internal Medicine

## 2021-01-18 ENCOUNTER — Other Ambulatory Visit: Payer: Self-pay | Admitting: Internal Medicine

## 2021-04-27 ENCOUNTER — Other Ambulatory Visit: Payer: Self-pay | Admitting: Internal Medicine

## 2021-05-02 ENCOUNTER — Other Ambulatory Visit (HOSPITAL_COMMUNITY)
Admission: RE | Admit: 2021-05-02 | Discharge: 2021-05-02 | Disposition: A | Discharge status: Home / Self Care | Payer: BC Managed Care – PPO | Source: Ambulatory Visit | Attending: Internal Medicine | Admitting: Internal Medicine

## 2021-05-02 ENCOUNTER — Ambulatory Visit (INDEPENDENT_AMBULATORY_CARE_PROVIDER_SITE_OTHER): Payer: BC Managed Care – PPO | Admitting: Internal Medicine

## 2021-05-02 ENCOUNTER — Encounter: Payer: Self-pay | Admitting: Internal Medicine

## 2021-05-02 ENCOUNTER — Other Ambulatory Visit: Payer: Self-pay

## 2021-05-02 VITALS — BP 128/80 | HR 68 | Temp 96.5°F | Resp 14 | Ht 63.0 in | Wt 181.4 lb

## 2021-05-02 DIAGNOSIS — R7303 Prediabetes: Secondary | ICD-10-CM

## 2021-05-02 DIAGNOSIS — I10 Essential (primary) hypertension: Secondary | ICD-10-CM | POA: Diagnosis not present

## 2021-05-02 DIAGNOSIS — Z9229 Personal history of other drug therapy: Secondary | ICD-10-CM | POA: Diagnosis not present

## 2021-05-02 DIAGNOSIS — Z124 Encounter for screening for malignant neoplasm of cervix: Secondary | ICD-10-CM | POA: Diagnosis not present

## 2021-05-02 DIAGNOSIS — Z1231 Encounter for screening mammogram for malignant neoplasm of breast: Secondary | ICD-10-CM

## 2021-05-02 DIAGNOSIS — Z0000 Encounter for general adult medical examination without abnormal findings: Secondary | ICD-10-CM

## 2021-05-02 DIAGNOSIS — Z6832 Body mass index (BMI) 32.0-32.9, adult: Secondary | ICD-10-CM

## 2021-05-02 DIAGNOSIS — E6609 Other obesity due to excess calories: Secondary | ICD-10-CM

## 2021-05-02 DIAGNOSIS — E7849 Other hyperlipidemia: Secondary | ICD-10-CM

## 2021-05-02 LAB — LIPID PANEL
Cholesterol: 194 mg/dL (ref 0–200)
HDL: 56.3 mg/dL (ref >39.00)
LDL Cholesterol: 117 mg/dL — ABNORMAL HIGH (ref 0–99)
NonHDL: 137.64
Total CHOL/HDL Ratio: 3
Triglycerides: 105 mg/dL (ref 0.0–149.0)
VLDL: 21 mg/dL (ref 0.0–40.0)

## 2021-05-02 LAB — COMPREHENSIVE METABOLIC PANEL
ALT: 23 U/L (ref 0–35)
AST: 19 U/L (ref 0–37)
Albumin: 4.1 g/dL (ref 3.5–5.2)
Alkaline Phosphatase: 87 U/L (ref 39–117)
BUN: 10 mg/dL (ref 6–23)
CO2: 33 mEq/L — ABNORMAL HIGH (ref 19–32)
Calcium: 10 mg/dL (ref 8.4–10.5)
Chloride: 100 mEq/L (ref 96–112)
Creatinine, Ser: 1.03 mg/dL (ref 0.40–1.20)
GFR: 62.14 mL/min (ref >60.00)
Glucose, Bld: 95 mg/dL (ref 70–99)
Potassium: 3.9 mEq/L (ref 3.5–5.1)
Sodium: 140 mEq/L (ref 135–145)
Total Bilirubin: 0.9 mg/dL (ref 0.2–1.2)
Total Protein: 7.2 g/dL (ref 6.0–8.3)

## 2021-05-02 LAB — HEMOGLOBIN A1C: Hgb A1c MFr Bld: 6 % (ref 4.6–6.5)

## 2021-05-02 MED ORDER — PHENTERMINE HCL 37.5 MG PO TABS: 37.5000 mg | ORAL_TABLET | Freq: Every day | ORAL | 0 refills | Status: DC

## 2021-05-02 NOTE — Progress Notes (Signed)
Patient ID: Bethany Fernandez, female    DOB: August 15, 1966  Age: 55 y.o. MRN: [id number]  The patient is here for annual preventive wellness examination and management of other chronic and acute problems.  This visit occurred during the SARS-CoV-2 public health emergency.  Safety protocols were in place, including screening questions prior to the visit, additional usage of staff PPE, and extensive cleaning of exam room while observing appropriate contact time as indicated for disinfecting solutions.   The risk factors are reflected in the social history.  The roster of all physicians providing medical care to patient - is listed in the Snapshot section of the chart.  Activities of daily living:  The patient is 100% independent in all ADLs: dressing, toileting, feeding as well as independent mobility  Home safety : The patient has smoke detectors in the home. They wear seatbelts.  There are no firearms at home. There is no violence in the home.   There is no risks for hepatitis, STDs or HIV. There is no   history of blood transfusion. They have no travel history to infectious disease endemic areas of the world.  The patient has seen their dentist in the last six month. They have seen their eye doctor in the last year. She denies hearing difficulty with regard to whispered voices and some television programs.  They have deferred audiologic testing in the last year.  They do not  have excessive sun exposure. Discussed the need for sun protection: hats, long sleeves and use of sunscreen if there is significant sun exposure.   Diet: the importance of a healthy diet is discussed. They do have a healthy diet.  The benefits of regular aerobic exercise were discussed. She exercises 4 times per week ,  60 minutes.   Depression screen: there are no signs or vegative symptoms of depression- irritability, change in appetite, anhedonia, sadness/tearfullness.  Cognitive assessment: the patient manages all their  financial and personal affairs and is actively engaged. They could relate day,date,year and events; recalled 2/3 objects at 3 minutes; performed clock-face test normally.  The following portions of the patient's history were reviewed and updated as appropriate: allergies, current medications, past family history, past medical history,  past surgical history, past social history  and problem list.  Visual acuity was not assessed per patient preference since she has regular follow up with her ophthalmologist. Hearing and body mass index were assessed and reviewed.   During the course of the visit the patient was educated and counseled about appropriate screening and preventive services including : fall prevention , diabetes screening, nutrition counseling, colorectal cancer screening, and recommended immunizations.    CC: The primary encounter diagnosis was Encounter for preventive health examination. Diagnoses of Prediabetes, Familial hyperlipidemia, high LDL, Essential hypertension, benign, COVID-19 vaccine series completed, Cervical cancer screening, Encounter for screening mammogram for malignant neoplasm of breast, and Class 1 obesity due to excess calories with body mass index (BMI) of 32.0 to 32.9 in adult, unspecified whether serious comorbidity present were also pertinent to this visit.  1) Weight gain.  Exercising,  but has not been able to move weight off the plateau.  Wants to use phentermie for one month to suppress appetite .  History Bethany Fernandez has a past medical history of Chicken pox, Fibrocystic breast (2013), Hyperlipidemia, Hypertension (1999), and Migraines.   She has a past surgical history that includes Partial hysterectomy (2012); Breast biopsy (2013); Abdominal hysterectomy; Ectopic pregnancy surgery; and Colonoscopy with propofol (N/A, 02/03/2018).  Her family history includes Alcohol abuse in her maternal grandfather; Breast cancer (age of onset: 3750) in her cousin; Diabetes in her  father; Heart disease (age of onset: 3957) in her maternal aunt; Hyperlipidemia in her father and mother; Hypertension in her father and mother; Kidney disease in her maternal uncle; Lung disease (age of onset: 6369) in her maternal grandfather; Stroke in her paternal grandfather.She reports that she has never smoked. She has never used smokeless tobacco. She reports current alcohol use. She reports that she does not use drugs.  Outpatient Medications Prior to Visit  Medication Sig Dispense Refill   amLODipine (NORVASC) 5 MG tablet TAKE 1 TABLET BY MOUTH EVERY DAY 90 tablet 1   atorvastatin (LIPITOR) 20 MG tablet TAKE 1 TABLET (20 MG TOTAL) BY MOUTH DAILY. PLEASE CALL OFFICE FOR APPT WITH PCP FOR FURTHER REFILLS 90 tablet 3   calcium-vitamin D (OSCAL WITH D) 250-125 MG-UNIT per tablet Take 1 tablet by mouth daily.     hydrochlorothiazide (HYDRODIURIL) 25 MG tablet TAKE 1 TABLET BY MOUTH EVERY DAY 90 tablet 3   Multiple Vitamin (MULTI VITAMIN DAILY PO) Take by mouth.     TURMERIC PO Take 1 tablet by mouth daily.     vitamin B-12 (CYANOCOBALAMIN) 1000 MCG tablet Take 1,000 mcg by mouth daily.     No facility-administered medications prior to visit.    Review of Systems  Patient denies headache, fevers, malaise, unintentional weight loss, skin rash, eye pain, sinus congestion and sinus pain, sore throat, dysphagia,  hemoptysis , cough, dyspnea, wheezing, chest pain, palpitations, orthopnea, edema, abdominal pain, nausea, melena, diarrhea, constipation, flank pain, dysuria, hematuria, urinary  Frequency, nocturia, numbness, tingling, seizures,  Focal weakness, Loss of consciousness,  Tremor, insomnia, depression, anxiety, and suicidal ideation.     Objective:  BP 128/80 (BP Location: Left Arm, Patient Position: Sitting, Cuff Size: Large)   Pulse 68   Temp (!) 96.5 F (35.8 C) (Temporal)   Resp 14   Ht 5\' 3"  (1.6 m)   Wt 181 lb 6.4 oz (82.3 kg)   SpO2 98%   BMI 32.13 kg/m   Physical  Exam  General Appearance:    Alert, cooperative, no distress, appears stated age  Head:    Normocephalic, without obvious abnormality, atraumatic  Eyes:    PERRL, conjunctiva/corneas clear, EOM's intact, fundi    benign, both eyes  Ears:    Normal TM's and external ear canals, both ears  Nose:   Nares normal, septum midline, mucosa normal, no drainage    or sinus tenderness  Throat:   Lips, mucosa, and tongue normal; teeth and gums normal  Neck:   Supple, symmetrical, trachea midline, no adenopathy;    thyroid:  no enlargement/tenderness/nodules; no carotid   bruit or JVD  Back:     Symmetric, no curvature, ROM normal, no CVA tenderness  Lungs:     Clear to auscultation bilaterally, respirations unlabored  Chest Wall:    No tenderness or deformity   Heart:    Regular rate and rhythm, S1 and S2 normal, no murmur, rub   or gallop  Breast Exam:    No tenderness, masses, or nipple abnormality  Abdomen:     Soft, non-tender, bowel sounds active all four quadrants,    no masses, no organomegaly  Genitalia:    Pelvic: cervix present, external genitalia normal, no adnexal masses or tenderness,  rectovaginal septum normal, uterus surgically  absent and vagina normal without discharge  Extremities:   Extremities  normal, atraumatic, no cyanosis or edema  Pulses:   2+ and symmetric all extremities  Skin:   Skin color, texture, turgor normal, no rashes or lesions  Lymph nodes:   Cervical, supraclavicular, and axillary nodes normal  Neurologic:   CNII-XII intact, normal strength, sensation and reflexes    throughout     Assessment & Plan:   Problem List Items Addressed This Visit       Unprioritized   Cervical cancer screening    PAP smear was normal but endocervical zone was not sampled. Will repeat in one year.       Relevant Orders   Cytology - PAP (Completed)   Encounter for preventive health examination - Primary    age appropriate education and counseling updated, referrals for  preventative services and immunizations addressed, dietary and smoking counseling addressed, most recent labs reviewed.  I have personally reviewed and have noted:   1) the patient's medical and social history 2) The pt's use of alcohol, tobacco, and illicit drugs 3) The patient's current medications and supplements 4) Functional ability including ADL's, fall risk, home safety risk, hearing and visual impairment 5) Diet and physical activities 6) Evidence for depression or mood disorder 7) The patient's height, weight, and BMI have been recorded in the chart   I have made referrals, and provided counseling and education based on review of the above      Essential hypertension, benign    Well controlled on current regimen of amlodipimne and hctz. . Renal function stable, no changes today. Advised to check BP after starting phentermine   Lab Results  Component Value Date   CREATININE 1.03 05/02/2021   Lab Results  Component Value Date   NA 140 05/02/2021   K 3.9 05/02/2021   CL 100 05/02/2021   CO2 33 (H) 05/02/2021         Relevant Orders   Comprehensive metabolic panel (Completed)   Familial hyperlipidemia, high LDL    Managed with atorvastatin. LDL is slightly above goal of 100,  lfts normal.  Will reassess after weight loss   Lab Results  Component Value Date   CHOL 194 05/02/2021   HDL 56.30 05/02/2021   LDLCALC 117 (H) 05/02/2021   LDLDIRECT 103.0 02/11/2017   TRIG 105.0 05/02/2021   CHOLHDL 3 05/02/2021   Lab Results  Component Value Date   ALT 23 05/02/2021   AST 19 05/02/2021   ALKPHOS 87 05/02/2021   BILITOT 0.9 05/02/2021         Relevant Orders   Lipid panel (Completed)   Obesity    I have authorized the use of phentermine for 1 month .       Relevant Medications   phentermine (ADIPEX-P) 37.5 MG tablet   Prediabetes    I have addressed  BMI and recommended a low glycemic index diet utilizing smaller more frequent meals to increase metabolism.   I have also recommended that patient start exercising with a goal of 30 minutes of aerobic exercise a minimum of 5 days per week.   Lab Results  Component Value Date   HGBA1C 6.0 05/02/2021         Relevant Orders   Hemoglobin A1c (Completed)   Other Visit Diagnoses     COVID-19 vaccine series completed       Relevant Orders   SARS-CoV-2 Semi-Quantitative Total Antibody, Spike   Encounter for screening mammogram for malignant neoplasm of breast       Relevant Orders  MM 3D SCREEN BREAST BILATERAL       Meds ordered this encounter  Medications   phentermine (ADIPEX-P) 37.5 MG tablet    Sig: Take 1 tablet (37.5 mg total) by mouth daily before breakfast.    Dispense:  30 tablet    Refill:  0    There are no discontinued medications.  Follow-up: No follow-ups on file.   Sherlene Shams, MD

## 2021-05-02 NOTE — Patient Instructions (Addendum)
I have sent in a  prescription for  phentermine for 1 month.   Please have your vital signs checked a week after starting and let me know the results.   Most people take 1/2 tablet in the morning,  The second half by 2 PM to avoid insomnia.  Your annual mammogram has been ordered.  You are encouraged (required) to call to make your appointment at [facility]  [phone]

## 2021-05-03 LAB — CYTOLOGY - PAP
Adequacy: ABSENT
Diagnosis: NEGATIVE

## 2021-05-04 ENCOUNTER — Encounter: Payer: Self-pay | Admitting: Internal Medicine

## 2021-05-05 DIAGNOSIS — R7303 Prediabetes: Secondary | ICD-10-CM | POA: Insufficient documentation

## 2021-05-05 LAB — SARS-COV-2 SEMI-QUANTITATIVE TOTAL ANTIBODY, SPIKE: SARS COV2 AB, Total Spike Semi QN: >2500 U/mL — ABNORMAL HIGH (ref <0.8)

## 2021-05-05 NOTE — Assessment & Plan Note (Signed)
Managed with atorvastatin. LDL is slightly above goal of 100,  lfts normal.  Will reassess after weight loss   Lab Results  Component Value Date   CHOL 194 05/02/2021   HDL 56.30 05/02/2021   LDLCALC 117 (H) 05/02/2021   LDLDIRECT 103.0 02/11/2017   TRIG 105.0 05/02/2021   CHOLHDL 3 05/02/2021   Lab Results  Component Value Date   ALT 23 05/02/2021   AST 19 05/02/2021   ALKPHOS 87 05/02/2021   BILITOT 0.9 05/02/2021    

## 2021-05-05 NOTE — Assessment & Plan Note (Addendum)
Well controlled on current regimen of amlodipimne and hctz. . Renal function stable, no changes today. Advised to check BP after starting phentermine   Lab Results  Component Value Date   CREATININE 1.03 05/02/2021   Lab Results  Component Value Date   NA 140 05/02/2021   K 3.9 05/02/2021   CL 100 05/02/2021   CO2 33 (H) 05/02/2021

## 2021-05-05 NOTE — Assessment & Plan Note (Addendum)
PAP smear was normal but endocervical zone was not sampled. Will repeat in one year.

## 2021-05-05 NOTE — Assessment & Plan Note (Signed)

## 2021-05-05 NOTE — Assessment & Plan Note (Signed)
I have addressed  BMI and recommended a low glycemic index diet utilizing smaller more frequent meals to increase metabolism.  I have also recommended that patient start exercising with a goal of 30 minutes of aerobic exercise a minimum of 5 days per week.   Lab Results  Component Value Date   HGBA1C 6.0 05/02/2021

## 2021-05-05 NOTE — Assessment & Plan Note (Signed)
I have authorized the use of phentermine for 1 month .

## 2021-05-22 ENCOUNTER — Other Ambulatory Visit: Payer: Self-pay

## 2021-05-22 ENCOUNTER — Ambulatory Visit
Admission: RE | Admit: 2021-05-22 | Discharge: 2021-05-22 | Disposition: A | Discharge status: Home / Self Care | Payer: BC Managed Care – PPO | Source: Ambulatory Visit | Attending: Internal Medicine | Admitting: Internal Medicine

## 2021-05-22 DIAGNOSIS — Z1231 Encounter for screening mammogram for malignant neoplasm of breast: Secondary | ICD-10-CM | POA: Diagnosis not present

## 2021-05-24 ENCOUNTER — Inpatient Hospital Stay
Admission: RE | Admit: 2021-05-24 | Discharge: 2021-05-24 | Disposition: A | Discharge status: Home / Self Care | Payer: Self-pay | Source: Ambulatory Visit | Attending: *Deleted | Admitting: *Deleted

## 2021-05-24 ENCOUNTER — Other Ambulatory Visit: Payer: Self-pay | Admitting: *Deleted

## 2021-05-24 ENCOUNTER — Other Ambulatory Visit: Payer: Self-pay | Admitting: Internal Medicine

## 2021-05-24 DIAGNOSIS — Z1231 Encounter for screening mammogram for malignant neoplasm of breast: Secondary | ICD-10-CM

## 2021-05-24 DIAGNOSIS — N6310 Unspecified lump in the right breast, unspecified quadrant: Secondary | ICD-10-CM

## 2021-05-24 DIAGNOSIS — R928 Other abnormal and inconclusive findings on diagnostic imaging of breast: Secondary | ICD-10-CM

## 2021-05-29 ENCOUNTER — Ambulatory Visit
Admission: RE | Admit: 2021-05-29 | Discharge: 2021-05-29 | Disposition: A | Discharge status: Home / Self Care | Payer: BC Managed Care – PPO | Source: Ambulatory Visit | Attending: Internal Medicine | Admitting: Internal Medicine

## 2021-05-29 ENCOUNTER — Other Ambulatory Visit: Payer: Self-pay

## 2021-05-29 DIAGNOSIS — N6310 Unspecified lump in the right breast, unspecified quadrant: Secondary | ICD-10-CM | POA: Diagnosis not present

## 2021-05-29 DIAGNOSIS — N6001 Solitary cyst of right breast: Secondary | ICD-10-CM | POA: Diagnosis not present

## 2021-05-29 DIAGNOSIS — R928 Other abnormal and inconclusive findings on diagnostic imaging of breast: Secondary | ICD-10-CM | POA: Diagnosis not present

## 2021-11-26 DIAGNOSIS — M9903 Segmental and somatic dysfunction of lumbar region: Secondary | ICD-10-CM | POA: Diagnosis not present

## 2021-11-26 DIAGNOSIS — M9902 Segmental and somatic dysfunction of thoracic region: Secondary | ICD-10-CM | POA: Diagnosis not present

## 2021-11-26 DIAGNOSIS — M9904 Segmental and somatic dysfunction of sacral region: Secondary | ICD-10-CM | POA: Diagnosis not present

## 2021-11-26 DIAGNOSIS — M9901 Segmental and somatic dysfunction of cervical region: Secondary | ICD-10-CM | POA: Diagnosis not present

## 2022-03-04 ENCOUNTER — Other Ambulatory Visit: Payer: Self-pay | Admitting: Internal Medicine

## 2022-03-05 ENCOUNTER — Other Ambulatory Visit: Payer: Self-pay

## 2022-03-05 ENCOUNTER — Encounter: Payer: Self-pay | Admitting: Internal Medicine

## 2022-03-05 MED ORDER — HYDROCHLOROTHIAZIDE 25 MG PO TABS: 25.0000 mg | ORAL_TABLET | Freq: Every day | ORAL | 3 refills | Status: DC

## 2022-03-05 MED ORDER — AMLODIPINE BESYLATE 5 MG PO TABS: 5.0000 mg | ORAL_TABLET | Freq: Every day | ORAL | 1 refills | Status: DC

## 2022-03-24 ENCOUNTER — Ambulatory Visit: Payer: BC Managed Care – PPO | Admitting: Internal Medicine

## 2022-03-24 ENCOUNTER — Encounter: Payer: Self-pay | Admitting: Internal Medicine

## 2022-03-24 VITALS — BP 118/78 | HR 90 | Temp 98.2°F | Ht 63.0 in | Wt 175.6 lb

## 2022-03-24 DIAGNOSIS — Z79899 Other long term (current) drug therapy: Secondary | ICD-10-CM

## 2022-03-24 DIAGNOSIS — Z6832 Body mass index (BMI) 32.0-32.9, adult: Secondary | ICD-10-CM

## 2022-03-24 DIAGNOSIS — R7303 Prediabetes: Secondary | ICD-10-CM

## 2022-03-24 DIAGNOSIS — E7849 Other hyperlipidemia: Secondary | ICD-10-CM | POA: Diagnosis not present

## 2022-03-24 DIAGNOSIS — E6609 Other obesity due to excess calories: Secondary | ICD-10-CM

## 2022-03-24 DIAGNOSIS — I10 Essential (primary) hypertension: Secondary | ICD-10-CM | POA: Diagnosis not present

## 2022-03-24 MED ORDER — SEMAGLUTIDE-WEIGHT MANAGEMENT 0.25 MG/0.5ML ~~LOC~~ SOAJ: 0.2500 mg | SUBCUTANEOUS | 2 refills | Status: DC

## 2022-03-24 NOTE — Patient Instructions (Signed)
I am recommending medication to help curb your appetite   1) The injectible medication  is called  wegovy.  It slows down your stomach emptying so you stay full longer . Inject 0.25 mg once a week.  Continue the same dose for 4 weeks.  If it causes more than 30 minutes of nausea,  call for zofran prescription   2) metformin xr  (if wegovy is not covered) taken once daily in the evening

## 2022-03-24 NOTE — Progress Notes (Unsigned)
Subjective:  Patient ID: Bethany Fernandez, female    DOB: Nov 09, 1965  Age: 56 y.o. MRN: [id number]  CC: The primary encounter diagnosis was Essential hypertension, benign. Diagnoses of Familial hyperlipidemia, high LDL, Prediabetes, and Long-term use of high-risk medication were also pertinent to this visit.   HPI Bethany Fernandez presents for follow up om chronic problems including hypertension ,hyperlipidemia and obesity Chief Complaint  Patient presents with   Follow-up    Follow up for medication refills   1) Hypertension: patient checks blood pressure twice weekly at home.  Readings have been for the most part <130/80 at rest . Patient is following a reduce salt diet most days and is taking medications as prescribed   2 )HLD:  taking atorvastatin.  Has not had LFTs since August 202   3) obesity: lost weight with 1/2 tab phentermine, was  maintaining 165 lbs from August to October ,  then mother in has declined in health. Stress of caregiving  weight  gain . Sent off the medication.  DOES NOT WANT TO RESUME DUE TO PERSONALITY CHANGES DURING THE WEEK OF WITHDRAWAL.  Will not consider metformin   4) Menopause:  managing hot flashes with OTC Boiron  Cyclease  taken SL     5)   Outpatient Medications Prior to Visit  Medication Sig Dispense Refill   amLODipine (NORVASC) 5 MG tablet Take 1 tablet (5 mg total) by mouth daily. 90 tablet 1   atorvastatin (LIPITOR) 20 MG tablet TAKE 1 TABLET (20 MG TOTAL) BY MOUTH DAILY. PLEASE CALL OFFICE FOR APPT WITH PCP FOR FURTHER REFILLS 90 tablet 3   calcium-vitamin D (OSCAL WITH D) 250-125 MG-UNIT per tablet Take 1 tablet by mouth daily.     hydrochlorothiazide (HYDRODIURIL) 25 MG tablet Take 1 tablet (25 mg total) by mouth daily. 90 tablet 3   Multiple Vitamin (MULTI VITAMIN DAILY PO) Take by mouth.     TURMERIC PO Take 1 tablet by mouth daily.     vitamin B-12 (CYANOCOBALAMIN) 1000 MCG tablet Take 1,000 mcg by mouth daily.     phentermine (ADIPEX-P)  37.5 MG tablet Take 1 tablet (37.5 mg total) by mouth daily before breakfast. 30 tablet 0   No facility-administered medications prior to visit.    Review of Systems;  Patient denies headache, fevers, malaise, unintentional weight loss, skin rash, eye pain, sinus congestion and sinus pain, sore throat, dysphagia,  hemoptysis , cough, dyspnea, wheezing, chest pain, palpitations, orthopnea, edema, abdominal pain, nausea, melena, diarrhea, constipation, flank pain, dysuria, hematuria, urinary  Frequency, nocturia, numbness, tingling, seizures,  Focal weakness, Loss of consciousness,  Tremor, insomnia, depression, anxiety, and suicidal ideation.      Objective:  BP 118/78 (BP Location: Left Arm, Patient Position: Sitting, Cuff Size: Large)   Pulse 90   Temp 98.2 F (36.8 C) (Oral)   Ht '5\' 3"'  (1.6 m)   Wt 175 lb 9.6 oz (79.7 kg)   SpO2 96%   BMI 31.11 kg/m   BP Readings from Last 3 Encounters:  03/24/22 118/78  05/02/21 128/80  03/09/20 136/88    Wt Readings from Last 3 Encounters:  03/24/22 175 lb 9.6 oz (79.7 kg)  05/02/21 181 lb 6.4 oz (82.3 kg)  03/09/20 181 lb 6.4 oz (82.3 kg)    General appearance: alert, cooperative and appears stated age Ears: normal TM's and external ear canals both ears Throat: lips, mucosa, and tongue normal; teeth and gums normal Neck: no adenopathy, no carotid bruit, supple,  symmetrical, trachea midline and thyroid not enlarged, symmetric, no tenderness/mass/nodules Back: symmetric, no curvature. ROM normal. No CVA tenderness. Lungs: clear to auscultation bilaterally Heart: regular rate and rhythm, S1, S2 normal, no murmur, click, rub or gallop Abdomen: soft, non-tender; bowel sounds normal; no masses,  no organomegaly Pulses: 2+ and symmetric Skin: Skin color, texture, turgor normal. No rashes or lesions Lymph nodes: Cervical, supraclavicular, and axillary nodes normal.  Lab Results  Component Value Date   HGBA1C 6.0 05/02/2021   HGBA1C 6.0  03/12/2020   HGBA1C 5.9 12/31/2017    Lab Results  Component Value Date   CREATININE 1.03 05/02/2021   CREATININE 0.95 03/12/2020   CREATININE 0.94 03/08/2019    Lab Results  Component Value Date   WBC 5.8 03/12/2020   HGB 12.6 03/12/2020   HCT 38.0 03/12/2020   PLT 251.0 03/12/2020   GLUCOSE 95 05/02/2021   CHOL 194 05/02/2021   TRIG 105.0 05/02/2021   HDL 56.30 05/02/2021   LDLDIRECT 103.0 02/11/2017   LDLCALC 117 (H) 05/02/2021   ALT 23 05/02/2021   AST 19 05/02/2021   NA 140 05/02/2021   K 3.9 05/02/2021   CL 100 05/02/2021   CREATININE 1.03 05/02/2021   BUN 10 05/02/2021   CO2 33 (H) 05/02/2021   TSH 0.44 03/12/2020   HGBA1C 6.0 05/02/2021   MICROALBUR 1.2 03/12/2020    US BREAST LTD UNI RIGHT INC AXILLA  Result Date: 05/29/2021 CLINICAL DATA:  Callback for RIGHT breast mass EXAM: ULTRASOUND OF THE RIGHT BREAST COMPARISON:  Previous exam(s). FINDINGS: On physical exam, no suspicious mass is appreciated. Targeted ultrasound was performed of the retroareolar breast. At 6 o'clock 1 cm from the nipple, there is an oval circumscribed anechoic mass with posterior acoustic enhancement. It measures 14 by 13 x 9 mm and is consistent with a benign simple cyst. This corresponds to the site of screening mammographic concern. IMPRESSION: There is a benign cyst at the site of screening mammographic concern. RECOMMENDATION: Screening mammogram in one year.(Code:SM-B-01Y) I have discussed the findings and recommendations with the patient. If applicable, a reminder letter will be sent to the patient regarding the next appointment. BI-RADS CATEGORY  2: Benign. Electronically Signed   By: Valentino Saxon M.D.   On: 05/29/2021 15:34   Assessment & Plan:   Problem List Items Addressed This Visit     Familial hyperlipidemia, high LDL   Relevant Orders   Lipid Profile   Direct LDL   Essential hypertension, benign - Primary   Relevant Orders   Comp Met (CMET)   Urine Microalbumin  w/creat. ratio   Prediabetes   Relevant Orders   Comp Met (CMET)   HgB A1c   Urine Microalbumin w/creat. ratio   Long-term use of high-risk medication   Relevant Orders   TSH   CBC with Differential/Platelet    I spent a total of   minutes with this patient in a face to face visit on the date of this encounter reviewing the last office visit with me on        ,  most recent with patient's cardiologist in    ,  patient'ss diet and eating habits, home blood pressure readings ,  most recent imaging study ,   and post visit ordering of testing and therapeutics.    Follow-up: No follow-ups on file.   Crecencio Mc, MD

## 2022-03-25 LAB — MICROALBUMIN / CREATININE URINE RATIO
Creatinine, Urine: 282.5 mg/dL
Microalb/Creat Ratio: 4 mg/g creat (ref 0–29)
Microalbumin, Urine: 10.7 ug/mL

## 2022-03-25 NOTE — Assessment & Plan Note (Signed)
Managed with atorvastatin. LDL is slightly above goal of 100,  lfts normal.  Will reassess after weight loss   Lab Results  Component Value Date   CHOL 194 05/02/2021   HDL 56.30 05/02/2021   LDLCALC 117 (H) 05/02/2021   LDLDIRECT 103.0 02/11/2017   TRIG 105.0 05/02/2021   CHOLHDL 3 05/02/2021   Lab Results  Component Value Date   ALT 23 05/02/2021   AST 19 05/02/2021   ALKPHOS 87 05/02/2021   BILITOT 0.9 05/02/2021

## 2022-03-25 NOTE — Assessment & Plan Note (Signed)
She is overdue for cmet ,  Taking atorvastatin

## 2022-03-25 NOTE — Assessment & Plan Note (Addendum)
I have addressed  BMI and recommended a low glycemic index diet utilizing smaller more frequent meals to increase metabolism.  I have also recommended that patient start exercising with a goal of 30 minutes of aerobic exercise a minimum of 5 days per week. She has trouble suppressing appetite and eating junk food and requesting Ozempic  Patient has no contraindications to using this medication.  The risks  and benefits were reviewed, and instructions on titration of dose were outlined.  Marland Kitchen

## 2022-03-25 NOTE — Assessment & Plan Note (Signed)
Well controlled on current regimen of hctz and amlodipine . Renal function is overdue , no changes today.  Lab Results  Component Value Date   CREATININE 1.03 05/02/2021

## 2022-05-12 ENCOUNTER — Encounter: Payer: Self-pay | Admitting: Internal Medicine

## 2022-05-12 ENCOUNTER — Other Ambulatory Visit (HOSPITAL_COMMUNITY)
Admission: RE | Admit: 2022-05-12 | Discharge: 2022-05-12 | Disposition: A | Discharge status: Home / Self Care | Payer: BC Managed Care – PPO | Source: Ambulatory Visit | Attending: Internal Medicine | Admitting: Internal Medicine

## 2022-05-12 ENCOUNTER — Ambulatory Visit (INDEPENDENT_AMBULATORY_CARE_PROVIDER_SITE_OTHER): Payer: BC Managed Care – PPO | Admitting: Internal Medicine

## 2022-05-12 VITALS — BP 138/88 | HR 78 | Temp 98.4°F | Ht 63.0 in | Wt 175.8 lb

## 2022-05-12 DIAGNOSIS — Z0000 Encounter for general adult medical examination without abnormal findings: Secondary | ICD-10-CM | POA: Diagnosis not present

## 2022-05-12 DIAGNOSIS — R7303 Prediabetes: Secondary | ICD-10-CM

## 2022-05-12 DIAGNOSIS — I10 Essential (primary) hypertension: Secondary | ICD-10-CM | POA: Diagnosis not present

## 2022-05-12 DIAGNOSIS — Z1159 Encounter for screening for other viral diseases: Secondary | ICD-10-CM | POA: Diagnosis not present

## 2022-05-12 DIAGNOSIS — E7849 Other hyperlipidemia: Secondary | ICD-10-CM

## 2022-05-12 DIAGNOSIS — Z114 Encounter for screening for human immunodeficiency virus [HIV]: Secondary | ICD-10-CM | POA: Diagnosis not present

## 2022-05-12 DIAGNOSIS — Z6832 Body mass index (BMI) 32.0-32.9, adult: Secondary | ICD-10-CM

## 2022-05-12 DIAGNOSIS — Z1231 Encounter for screening mammogram for malignant neoplasm of breast: Secondary | ICD-10-CM | POA: Diagnosis not present

## 2022-05-12 DIAGNOSIS — Z90711 Acquired absence of uterus with remaining cervical stump: Secondary | ICD-10-CM

## 2022-05-12 DIAGNOSIS — R5383 Other fatigue: Secondary | ICD-10-CM | POA: Diagnosis not present

## 2022-05-12 DIAGNOSIS — Z124 Encounter for screening for malignant neoplasm of cervix: Secondary | ICD-10-CM | POA: Diagnosis not present

## 2022-05-12 DIAGNOSIS — E6609 Other obesity due to excess calories: Secondary | ICD-10-CM

## 2022-05-12 NOTE — Progress Notes (Signed)
The patient is here for annual preventive examination and management of other chronic and acute problems.   The risk factors are reflected in the social history.   The roster of all physicians providing medical care to patient - is listed in the Snapshot section of the chart.   Activities of daily living:  The patient is 100% independent in all ADLs: dressing, toileting, feeding as well as independent mobility   Home safety : The patient has smoke detectors in the home. They wear seatbelts.  There are no unsecured firearms at home. There is no violence in the home.    There is no risks for hepatitis, STDs or HIV. There is no   history of blood transfusion. They have no travel history to infectious disease endemic areas of the world.   The patient has seen their dentist in the last six month. They have seen their eye doctor in the last year. The patinet  denies slight hearing difficulty with regard to whispered voices and some television programs.  They have deferred audiologic testing in the last year.  They do not  have excessive sun exposure. Discussed the need for sun protection: hats, long sleeves and use of sunscreen if there is significant sun exposure.    Diet: the importance of a healthy diet is discussed. They do have a healthy diet.   The benefits of regular aerobic exercise were discussed. The patient  exercises  3 to 5 days per week  for  60 minutes.    Depression screen: there are no signs or vegative symptoms of depression- irritability, change in appetite, anhedonia, sadness/tearfullness.   The following portions of the patient's history were reviewed and updated as appropriate: allergies, current medications, past family history, past medical history,  past surgical history, past social history  and problem list.   Visual acuity was not assessed per patient preference since the patient has regular follow up with an  ophthalmologist. Hearing and body mass index were assessed and  reviewed.    During the course of the visit the patient was educated and counseled about appropriate screening and preventive services including : fall prevention , diabetes screening, nutrition counseling, colorectal cancer screening, and recommended immunizations.    Chief Complaint:  None.    Review of Symptoms  Patient denies headache, fevers, malaise, unintentional weight loss, skin rash, eye pain, sinus congestion and sinus pain, sore throat, dysphagia,  hemoptysis , cough, dyspnea, wheezing, chest pain, palpitations, orthopnea, edema, abdominal pain, nausea, melena, diarrhea, constipation, flank pain, dysuria, hematuria, urinary  Frequency, nocturia, numbness, tingling, seizures,  Focal weakness, Loss of consciousness,  Tremor, insomnia, depression, anxiety, and suicidal ideation.    Physical Exam:  BP 138/88 (BP Location: Left Arm, Patient Position: Sitting, Cuff Size: Large)   Pulse 78   Temp 98.4 F (36.9 C) (Oral)   Ht 5\' 3"  (1.6 m)   Wt 175 lb 12.8 oz (79.7 kg)   SpO2 97%   BMI 31.14 kg/m    General Appearance:    Alert, cooperative, no distress, appears stated age  Head:    Normocephalic, without obvious abnormality, atraumatic  Eyes:    PERRL, conjunctiva/corneas clear, EOM's intact, fundi    benign, both eyes  Ears:    Normal TM's and external ear canals, both ears  Nose:   Nares normal, septum midline, mucosa normal, no drainage    or sinus tenderness  Throat:   Lips, mucosa, and tongue normal; teeth and gums normal  Neck:  Supple, symmetrical, trachea midline, no adenopathy;    thyroid:  no enlargement/tenderness/nodules; no carotid   bruit or JVD  Back:     Symmetric, no curvature, ROM normal, no CVA tenderness  Lungs:     Clear to auscultation bilaterally, respirations unlabored  Chest Wall:    No tenderness or deformity   Heart:    Regular rate and rhythm, S1 and S2 normal, no murmur, rub   or gallop  Breast Exam:    No tenderness, masses, or nipple  abnormality  Abdomen:     Soft, non-tender, bowel sounds active all four quadrants,    no masses, no organomegaly  Genitalia:    Pelvic: cervix normal in appearance, external genitalia normal, no adnexal masses or tenderness, no cervical motion tenderness, rectovaginal septum normal, uterus surgically absent, and vagina normal without discharge  Extremities:   Extremities normal, atraumatic, no cyanosis or edema  Pulses:   2+ and symmetric all extremities  Skin:   Skin color, texture, turgor normal, no rashes or lesions  Lymph nodes:   Cervical, supraclavicular, and axillary nodes normal  Neurologic:   CNII-XII intact, normal strength, sensation and reflexes    throughout     Assessment and Plan:  Obesity counselling given.  She is exercising and reducing portion size. a1c was 6.0  Last year ,  Will repeat  History of hysterectomy, supracervical PAP smear done today   Prediabetes I have ordered a repeat a1c  and recommended a low glycemic index diet and continued participation in daily aerobic exercise with a goal of 30 minutes of aerobic exercise a minimum of 5 days per week.   Encounter for preventive health examination age appropriate education and counseling updated, referrals for preventative services and immunizations addressed, dietary and smoking counseling addressed, most recent labs reviewed.  I have personally reviewed and have noted:   1) the patient's medical and social history 2) The pt's use of alcohol, tobacco, and illicit drugs 3) The patient's current medications and supplements 4) Functional ability including ADL's, fall risk, home safety risk, hearing and visual impairment 5) Diet and physical activities 6) Evidence for depression or mood disorder 7) The patient's height, weight, and BMI have been recorded in the chart  I have made referrals, and provided counseling and education based on review of the above   Updated Medication List Outpatient Encounter  Medications as of 05/12/2022  Medication Sig   amLODipine (NORVASC) 5 MG tablet Take 1 tablet (5 mg total) by mouth daily.   atorvastatin (LIPITOR) 20 MG tablet TAKE 1 TABLET (20 MG TOTAL) BY MOUTH DAILY. PLEASE CALL OFFICE FOR APPT WITH PCP FOR FURTHER REFILLS   calcium-vitamin D (OSCAL WITH D) 250-125 MG-UNIT per tablet Take 1 tablet by mouth daily.   hydrochlorothiazide (HYDRODIURIL) 25 MG tablet Take 1 tablet (25 mg total) by mouth daily.   Multiple Vitamin (MULTI VITAMIN DAILY PO) Take by mouth.   TURMERIC PO Take 1 tablet by mouth daily.   vitamin B-12 (CYANOCOBALAMIN) 1000 MCG tablet Take 1,000 mcg by mouth daily.   [DISCONTINUED] Semaglutide-Weight Management 0.25 MG/0.5ML SOAJ Inject 0.25 mg into the skin once a week. (Patient not taking: Reported on 05/12/2022)   No facility-administered encounter medications on file as of 05/12/2022.

## 2022-05-12 NOTE — Assessment & Plan Note (Signed)

## 2022-05-12 NOTE — Assessment & Plan Note (Signed)
PAP smear done today  

## 2022-05-12 NOTE — Assessment & Plan Note (Signed)
counselling given.  She is exercising and reducing portion size. a1c was 6.0  Last year ,  Will repeat

## 2022-05-12 NOTE — Assessment & Plan Note (Signed)
I have ordered a repeat a1c  and recommended a low glycemic index diet and continued participation in daily aerobic exercise with a goal of 30 minutes of aerobic exercise a minimum of 5 days per week.

## 2022-05-13 LAB — HEPATITIS C ANTIBODY: Hepatitis C Ab: NONREACTIVE

## 2022-05-13 LAB — COMPREHENSIVE METABOLIC PANEL
ALT: 21 U/L (ref 0–35)
AST: 21 U/L (ref 0–37)
Albumin: 4 g/dL (ref 3.5–5.2)
Alkaline Phosphatase: 69 U/L (ref 39–117)
BUN: 11 mg/dL (ref 6–23)
CO2: 30 mEq/L (ref 19–32)
Calcium: 9 mg/dL (ref 8.4–10.5)
Chloride: 103 mEq/L (ref 96–112)
Creatinine, Ser: 0.93 mg/dL (ref 0.40–1.20)
GFR: 69.74 mL/min (ref >60.00)
Glucose, Bld: 91 mg/dL (ref 70–99)
Potassium: 4.3 mEq/L (ref 3.5–5.1)
Sodium: 142 mEq/L (ref 135–145)
Total Bilirubin: 0.3 mg/dL (ref 0.2–1.2)
Total Protein: 6.9 g/dL (ref 6.0–8.3)

## 2022-05-13 LAB — CBC WITH DIFFERENTIAL/PLATELET
Basophils Absolute: 0.1 10*3/uL (ref 0.0–0.1)
Basophils Relative: 1.4 % (ref 0.0–3.0)
Eosinophils Absolute: 0.1 10*3/uL (ref 0.0–0.7)
Eosinophils Relative: 3.5 % (ref 0.0–5.0)
HCT: 37.3 % (ref 36.0–46.0)
Hemoglobin: 12.5 g/dL (ref 12.0–15.0)
Lymphocytes Relative: 43.6 % (ref 12.0–46.0)
Lymphs Abs: 1.8 10*3/uL (ref 0.7–4.0)
MCHC: 33.6 g/dL (ref 30.0–36.0)
MCV: 87.8 fl (ref 78.0–100.0)
Monocytes Absolute: 0.4 10*3/uL (ref 0.1–1.0)
Monocytes Relative: 9.8 % (ref 3.0–12.0)
Neutro Abs: 1.7 10*3/uL (ref 1.4–7.7)
Neutrophils Relative %: 41.7 % — ABNORMAL LOW (ref 43.0–77.0)
Platelets: 237 10*3/uL (ref 150.0–400.0)
RBC: 4.24 Mil/uL (ref 3.87–5.11)
RDW: 13.8 % (ref 11.5–15.5)
WBC: 4.2 10*3/uL (ref 4.0–10.5)

## 2022-05-13 LAB — LIPID PANEL
Cholesterol: 190 mg/dL (ref 0–200)
HDL: 53.4 mg/dL (ref >39.00)
LDL Cholesterol: 108 mg/dL — ABNORMAL HIGH (ref 0–99)
NonHDL: 136.78
Total CHOL/HDL Ratio: 4
Triglycerides: 144 mg/dL (ref 0.0–149.0)
VLDL: 28.8 mg/dL (ref 0.0–40.0)

## 2022-05-13 LAB — HIV ANTIBODY (ROUTINE TESTING W REFLEX): HIV 1&2 Ab, 4th Generation: NONREACTIVE

## 2022-05-13 LAB — HEMOGLOBIN A1C: Hgb A1c MFr Bld: 6.1 % (ref 4.6–6.5)

## 2022-05-15 LAB — CYTOLOGY - PAP
Comment: NEGATIVE
Diagnosis: NEGATIVE
High risk HPV: NEGATIVE

## 2022-07-02 ENCOUNTER — Ambulatory Visit: Payer: BC Managed Care – PPO

## 2022-07-04 ENCOUNTER — Other Ambulatory Visit: Payer: Self-pay

## 2022-07-04 ENCOUNTER — Encounter: Payer: Self-pay | Admitting: Internal Medicine

## 2022-07-04 MED ORDER — ATORVASTATIN CALCIUM 20 MG PO TABS: 20.0000 mg | ORAL_TABLET | Freq: Every day | ORAL | 3 refills | Status: DC

## 2022-09-05 ENCOUNTER — Other Ambulatory Visit: Payer: Self-pay | Admitting: Internal Medicine

## 2022-10-14 ENCOUNTER — Ambulatory Visit
Admission: RE | Admit: 2022-10-14 | Discharge: 2022-10-14 | Disposition: A | Discharge status: Home / Self Care | Payer: No Typology Code available for payment source | Source: Ambulatory Visit | Attending: Internal Medicine | Admitting: Internal Medicine

## 2022-10-14 DIAGNOSIS — Z1231 Encounter for screening mammogram for malignant neoplasm of breast: Secondary | ICD-10-CM | POA: Diagnosis present

## 2023-03-17 ENCOUNTER — Other Ambulatory Visit: Payer: Self-pay | Admitting: Internal Medicine

## 2023-04-07 ENCOUNTER — Telehealth: Payer: 59 | Admitting: Internal Medicine

## 2023-04-07 ENCOUNTER — Encounter: Payer: Self-pay | Admitting: Internal Medicine

## 2023-04-07 VITALS — Ht 63.0 in | Wt 175.0 lb

## 2023-04-07 DIAGNOSIS — S46211A Strain of muscle, fascia and tendon of other parts of biceps, right arm, initial encounter: Secondary | ICD-10-CM | POA: Diagnosis not present

## 2023-04-07 DIAGNOSIS — J069 Acute upper respiratory infection, unspecified: Secondary | ICD-10-CM

## 2023-04-07 MED ORDER — PREDNISONE 10 MG PO TABS: ORAL_TABLET | ORAL | 0 refills | Status: DC

## 2023-04-07 NOTE — Patient Instructions (Signed)
You have a viral  Syndrome .  The post nasal drip is likely the cause of your productive  cough .  Prednisone taper for the inflammation .  Continue Theraflu for other symptoms.   If you develop T > 100.4,  Green nasal discharge,  Or facial pain,  notify me via Mychart and I will add an antibiotic     2) it appears you STRAINED your bicep. Ice,  rest and prednisone should help heal.
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
## 2023-04-07 NOTE — Assessment & Plan Note (Signed)
Symptoms are mild and have been present for 5 days.  Prednisone taper given.  If still symptomatic after taper she will notify me on Monday and I will add an antibiotic for sinusitis
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
## 2023-04-25 ENCOUNTER — Other Ambulatory Visit: Payer: Self-pay | Admitting: Internal Medicine

## 2023-04-28 ENCOUNTER — Ambulatory Visit: Payer: No Typology Code available for payment source | Admitting: Family Medicine

## 2023-04-28 VITALS — BP 126/78 | HR 80 | Temp 98.0°F | Resp 16 | Ht 63.0 in | Wt 176.4 lb

## 2023-04-28 DIAGNOSIS — R221 Localized swelling, mass and lump, neck: Secondary | ICD-10-CM

## 2023-04-28 DIAGNOSIS — J029 Acute pharyngitis, unspecified: Secondary | ICD-10-CM | POA: Diagnosis not present

## 2023-04-28 NOTE — Progress Notes (Unsigned)
SUBJECTIVE:   Chief Complaint  Patient presents with   Cyst    Left underside of chin. Hurts to turn head and swollen X Sunday am   HPI Presents to clinic for concern of painful area to left side of neck  Symptoms started 2 days ago.  Noticed pain when turing neck to right side.  Has also had some pain when swallowing but still able to tolerate solids and liquids.  Has noticed some mild swelling of left side of neck.  Denies any fevers, hoarse throat, cough, difficulty breathing, tongue swelling, ear pain or discharge.  Denies any recent sick contacts.  Has had viral URI with cough in past month that has seemed to resolve.  PERTINENT PMH / PSH: None  OBJECTIVE:  BP 126/78   Pulse 80   Temp 98 F (36.7 C)   Resp 16   Ht 5\' 3"  (1.6 m)   Wt 176 lb 6 oz (80 kg)   SpO2 98%   BMI 31.24 kg/m    Physical Exam HENT:     Mouth/Throat:     Mouth: Mucous membranes are moist. No oral lesions.     Dentition: Does not have dentures. No dental tenderness, gingival swelling, dental caries, dental abscesses or gum lesions.     Tongue: No lesions. Tongue does not deviate from midline.     Palate: No mass.     Pharynx: Posterior oropharyngeal erythema present. No oropharyngeal exudate.     Tonsils: No tonsillar exudate or tonsillar abscesses.  Neck:     Thyroid: No thyromegaly or thyroid tenderness.   Lymphadenopathy:     Cervical: Cervical adenopathy present.        04/28/2023    1:57 PM 05/12/2022    2:01 PM 03/24/2022    2:25 PM 05/02/2021   11:01 AM 03/09/2020    4:20 PM  Depression screen PHQ 2/9  Decreased Interest 0 0 0 0 3  Down, Depressed, Hopeless 0 0 0 0 2  PHQ - 2 Score 0 0 0 0 5  Altered sleeping 0    3  Tired, decreased energy 0    2  Change in appetite 0    3  Feeling bad or failure about yourself  0    2  Trouble concentrating 0    1  Moving slowly or fidgety/restless 0    0  Suicidal thoughts 0    0  PHQ-9 Score 0    16  Difficult doing work/chores Not  difficult at all    Somewhat difficult      04/28/2023    1:57 PM  GAD 7 : Generalized Anxiety Score  Nervous, Anxious, on Edge 0  Control/stop worrying 0  Worry too much - different things 0  Trouble relaxing 1  Restless 0  Easily annoyed or irritable 1  Afraid - awful might happen 0  Total GAD 7 Score 2  Anxiety Difficulty Not difficult at all    ASSESSMENT/PLAN:  Sore throat Assessment & Plan: Rapid strep negative Throat culture negative CBC normal Likely viral etiology with reactive lymphadenopathy Continue Tylenol or Ibuprofen for discomfort Warm water gargles Honey tea for sore throat Follow up with PCP if no improvement  Orders: -     POCT rapid strep A -     Culture, Group A Strep -     CBC  Lump in neck Assessment & Plan: Likely reactive lymph node given pain and post viral URI.   Offered to  monitor for worsening symptoms before obtaining imaging.  Patient prefers to proceed with imaging Can take Tylenol for discomfort as needed If no improvement follow up with PCP  Orders: -     US SOFT TISSUE HEAD & NECK (NON-THYROID); Future   PDMP reviewed  Return if symptoms worsen or fail to improve, for PCP.  Dana Allan, MD

## 2023-04-28 NOTE — Patient Instructions (Signed)
It was a pleasure meeting you today. Thank you for allowing me to take part in your health care.  Our goals for today as we discussed include:  Rapid strep negative Sending for further evaluation Will call with results if needing treatment Check blood work today  Referral sent for neck ultrasound.  They will call you with appointment  Continue Tylenol or Ibuprofen for discomfort Warm water gargles Honey tea for sore throat  If symptoms worsen please notify MD or go to Urgent care/ED   If you have any questions or concerns, please do not hesitate to call the office at [phone].  I look forward to our next visit and until then take care and stay safe.  Regards,   Dana Allan, MD   [facility]

## 2023-04-29 ENCOUNTER — Encounter: Payer: Self-pay | Admitting: Family Medicine

## 2023-04-29 ENCOUNTER — Ambulatory Visit: Payer: 59 | Admitting: Family

## 2023-04-30 ENCOUNTER — Ambulatory Visit
Admission: RE | Admit: 2023-04-30 | Discharge: 2023-04-30 | Disposition: A | Discharge status: Home / Self Care | Payer: No Typology Code available for payment source | Source: Ambulatory Visit | Attending: Family Medicine | Admitting: Family Medicine

## 2023-04-30 DIAGNOSIS — R221 Localized swelling, mass and lump, neck: Secondary | ICD-10-CM | POA: Diagnosis not present

## 2023-05-08 ENCOUNTER — Encounter: Payer: Self-pay | Admitting: Family Medicine

## 2023-05-11 ENCOUNTER — Encounter: Payer: Self-pay | Admitting: Internal Medicine

## 2023-05-11 MED ORDER — ATORVASTATIN CALCIUM 20 MG PO TABS: 20.0000 mg | ORAL_TABLET | Freq: Every day | ORAL | 0 refills | Status: DC

## 2023-05-16 ENCOUNTER — Encounter: Payer: Self-pay | Admitting: Family Medicine

## 2023-05-16 DIAGNOSIS — J029 Acute pharyngitis, unspecified: Secondary | ICD-10-CM | POA: Insufficient documentation

## 2023-05-16 DIAGNOSIS — R221 Localized swelling, mass and lump, neck: Secondary | ICD-10-CM

## 2023-05-16 HISTORY — DX: Localized swelling, mass and lump, neck: R22.1

## 2023-05-16 NOTE — Assessment & Plan Note (Addendum)
Rapid strep negative Throat culture negative CBC normal Likely viral etiology with reactive lymphadenopathy Continue Tylenol or Ibuprofen for discomfort Warm water gargles Honey tea for sore throat Follow up with PCP if no improvement

## 2023-05-16 NOTE — Assessment & Plan Note (Signed)
Likely reactive lymph node given pain and post viral URI.   Offered to monitor for worsening symptoms before obtaining imaging.  Patient prefers to proceed with imaging Can take Tylenol for discomfort as needed If no improvement follow up with PCP

## 2023-05-17 IMAGING — MG MM DIGITAL SCREENING BILAT W/ TOMO AND CAD
8 series · 8 of 24 positions shown · non-contrast
Comparison: Previous exam(s).

CLINICAL DATA: Screening.

EXAM:
DIGITAL SCREENING BILATERAL MAMMOGRAM WITH TOMOSYNTHESIS AND CAD
TECHNIQUE: Bilateral screening digital craniocaudal and mediolateral oblique
mammograms were obtained. Bilateral screening digital breast
tomosynthesis was performed. The images were evaluated with
computer-aided detection.

[R CC synth-2D]
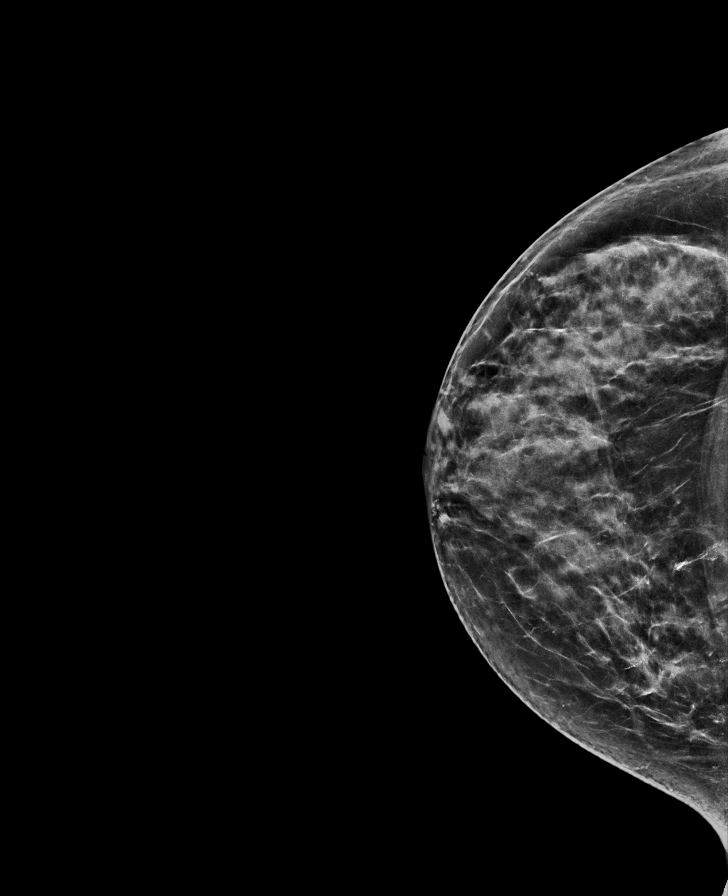

[L MLO synth-2D]
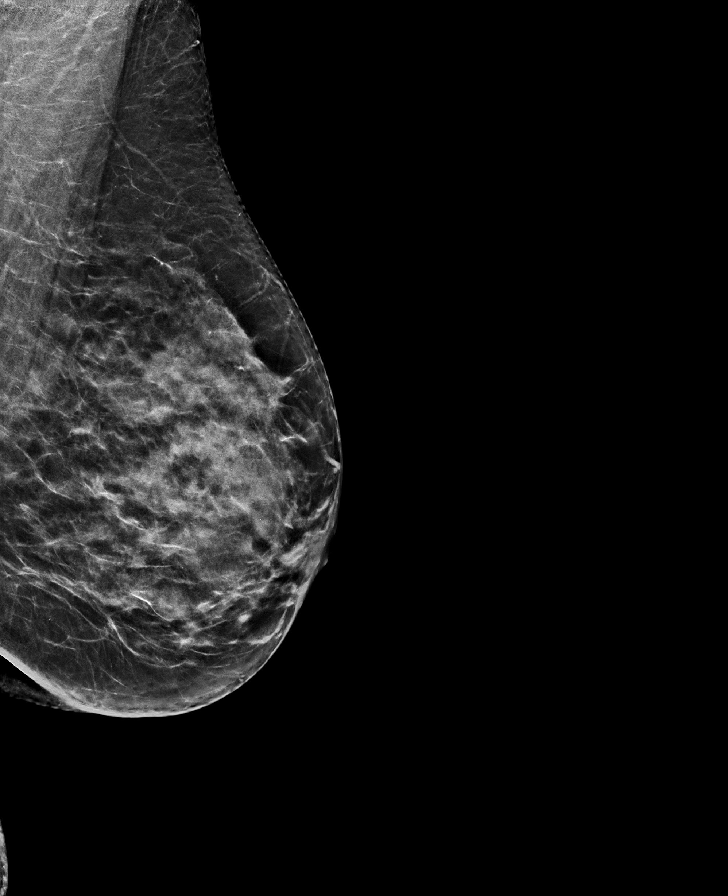

[L CC synth-2D]
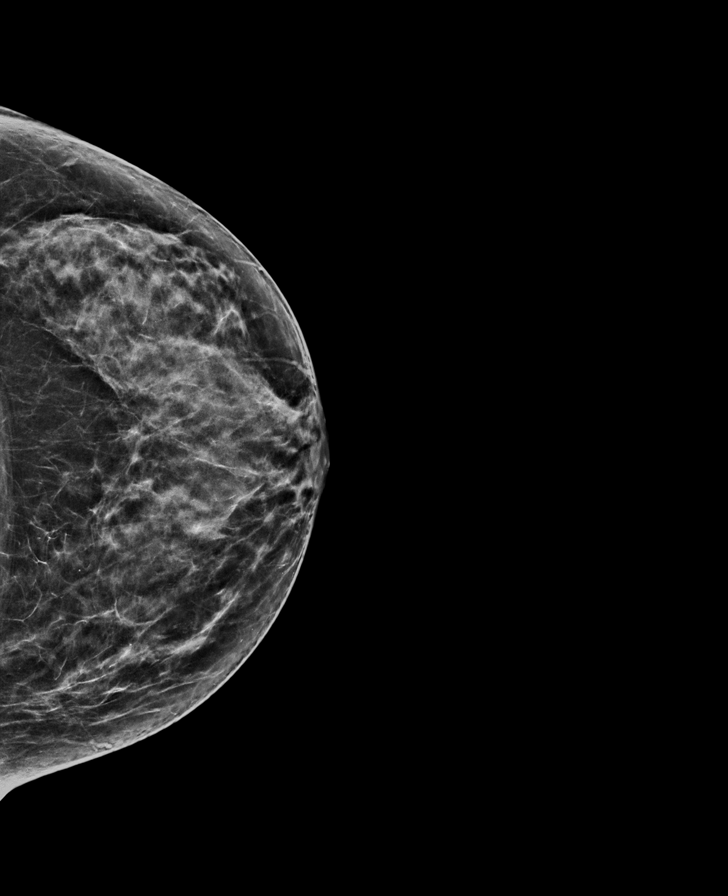

[R MLO synth-2D]
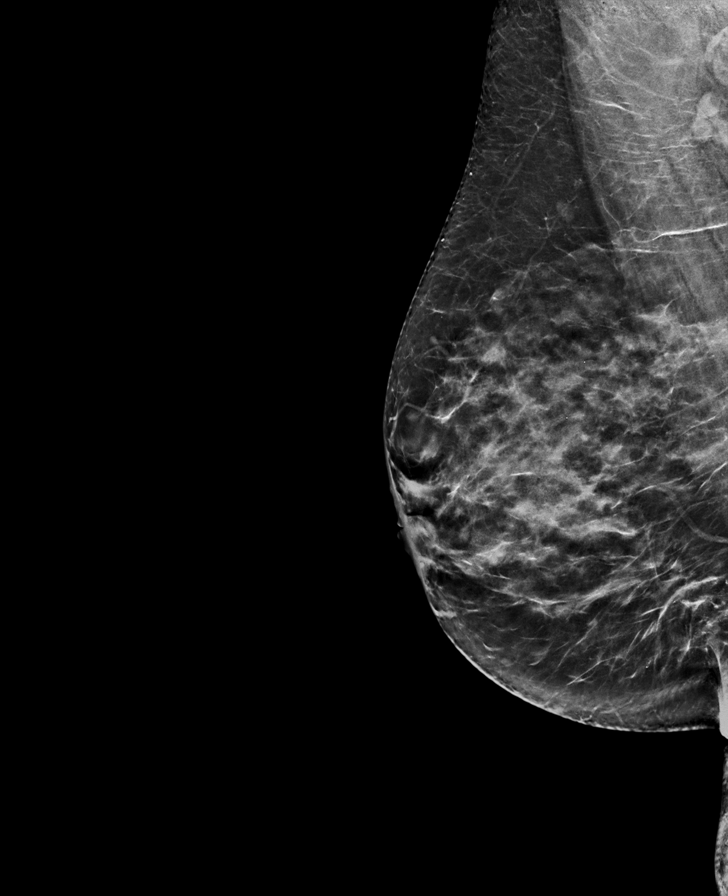

[R MLO tomo · tomo slice 35/70.0]
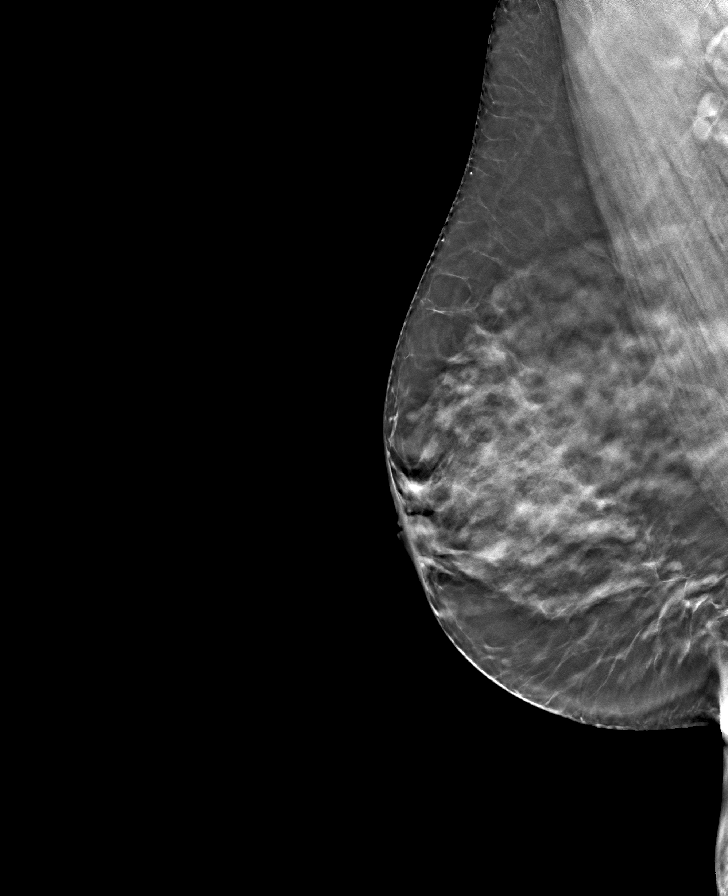

[L MLO tomo · tomo slice 33/66.0]
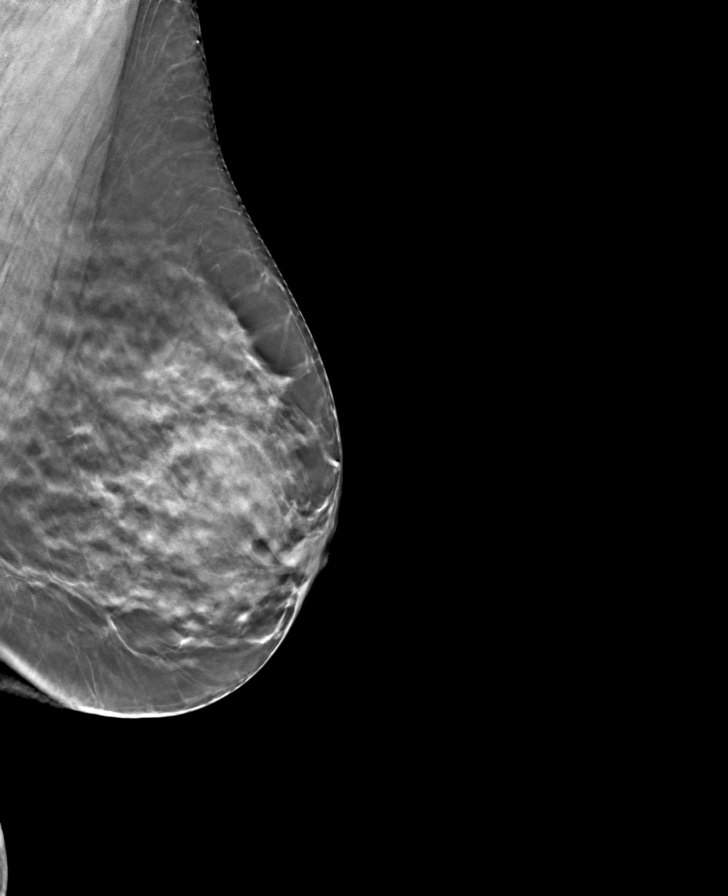

[L CC tomo · tomo slice 32/63.0]
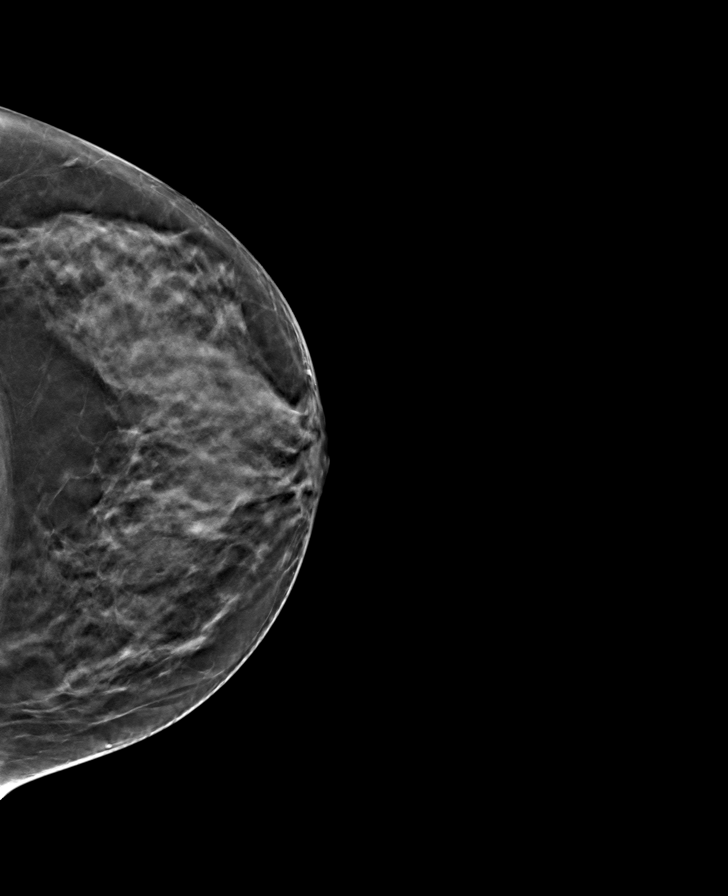

[R CC tomo · tomo slice 35/68.0]
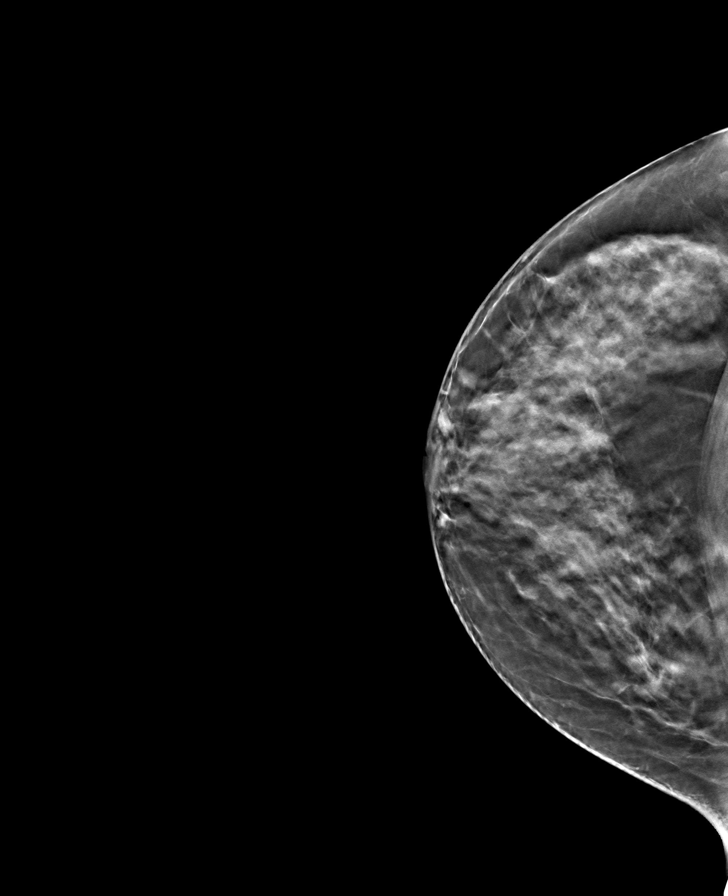

[8 of 24 positions shown; findings below may reference images not displayed]

ACR Breast Density Category c: The breast tissue is heterogeneously
dense, which may obscure small masses.
FINDINGS: In the right breast, a possible mass warrants further evaluation. In
the left breast, no findings suspicious for malignancy.
IMPRESSION: Further evaluation is suggested for possible mass in the right
breast.

RECOMMENDATION:
Ultrasound of the right breast. (Code:[id number])

The patient will be contacted regarding the findings, and additional
imaging will be scheduled.

BI-RADS CATEGORY  0: Incomplete. Need additional imaging evaluation
and/or prior mammograms for comparison.

## 2023-05-24 IMAGING — US US BREAST*R* LIMITED INC AXILLA
1 series · 5 of 5 positions shown · non-contrast
Comparison: Previous exam(s).

CLINICAL DATA: Callback for RIGHT breast mass

EXAM:
ULTRASOUND OF THE RIGHT BREAST

[Series 1: us breast*right* limited inc axilla · 0.06mm/px · 5 of 5 slices shown]
[im 1/5]
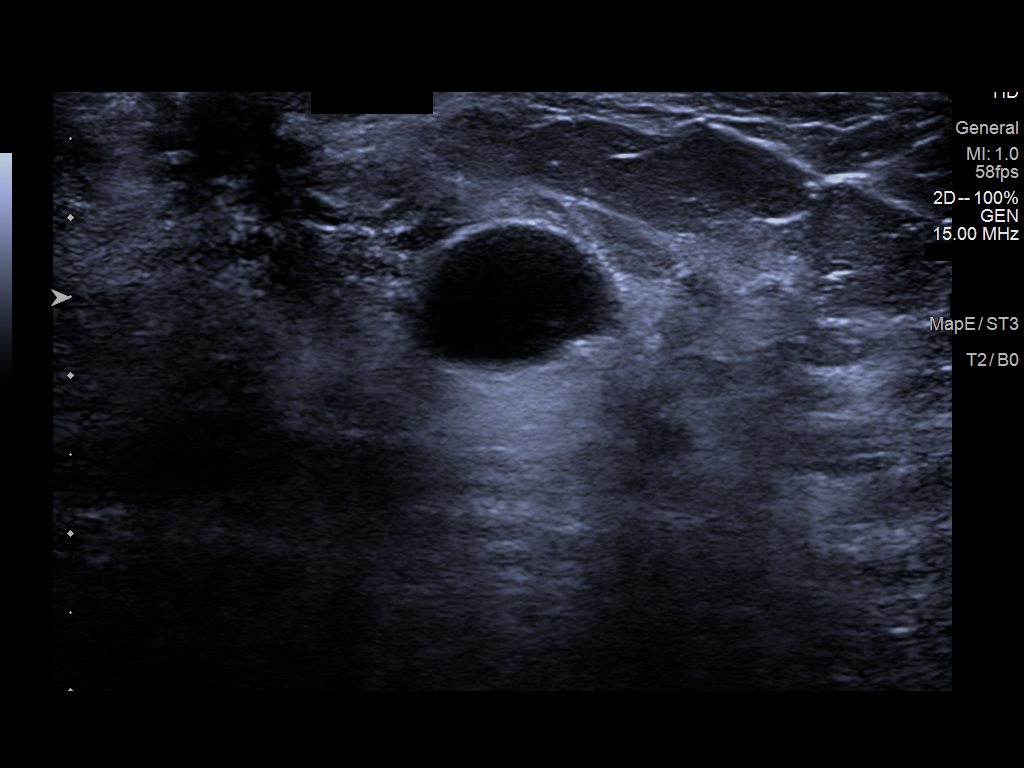
[im 2/5]
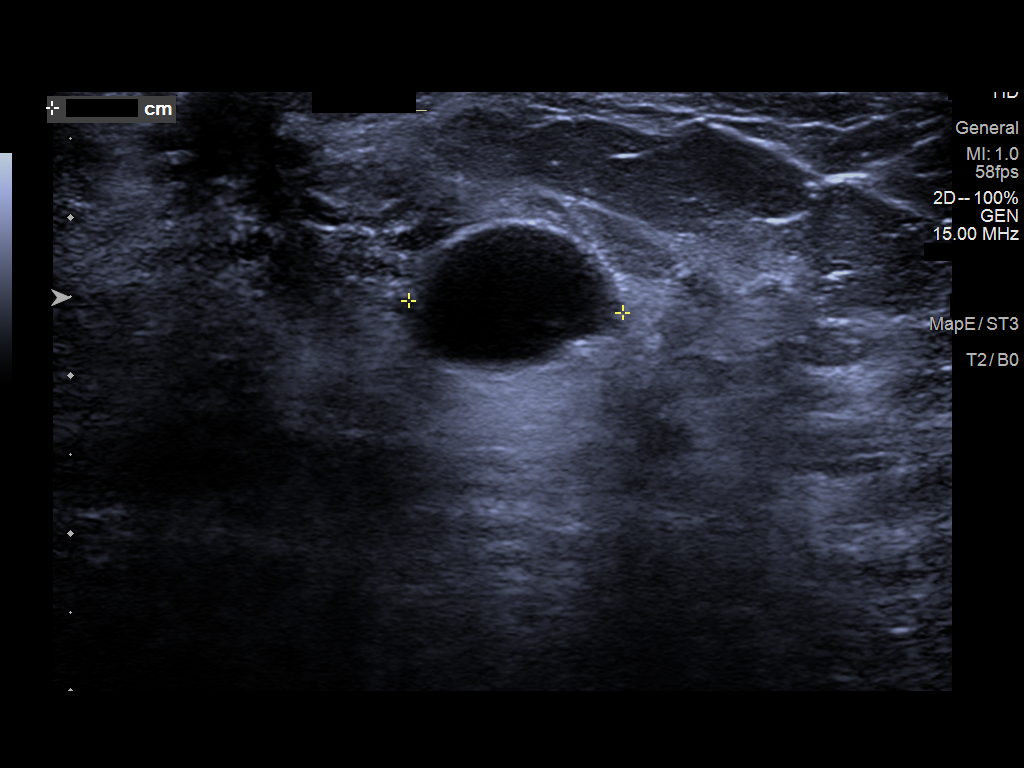
[im 3/5]
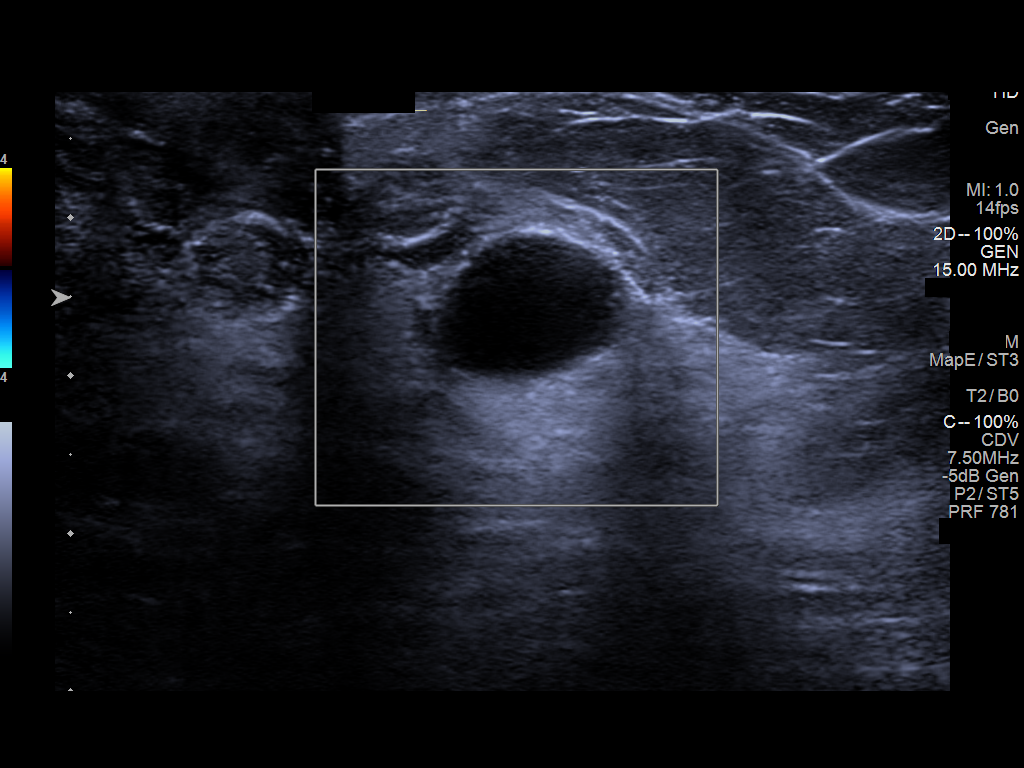
[im 4/5]
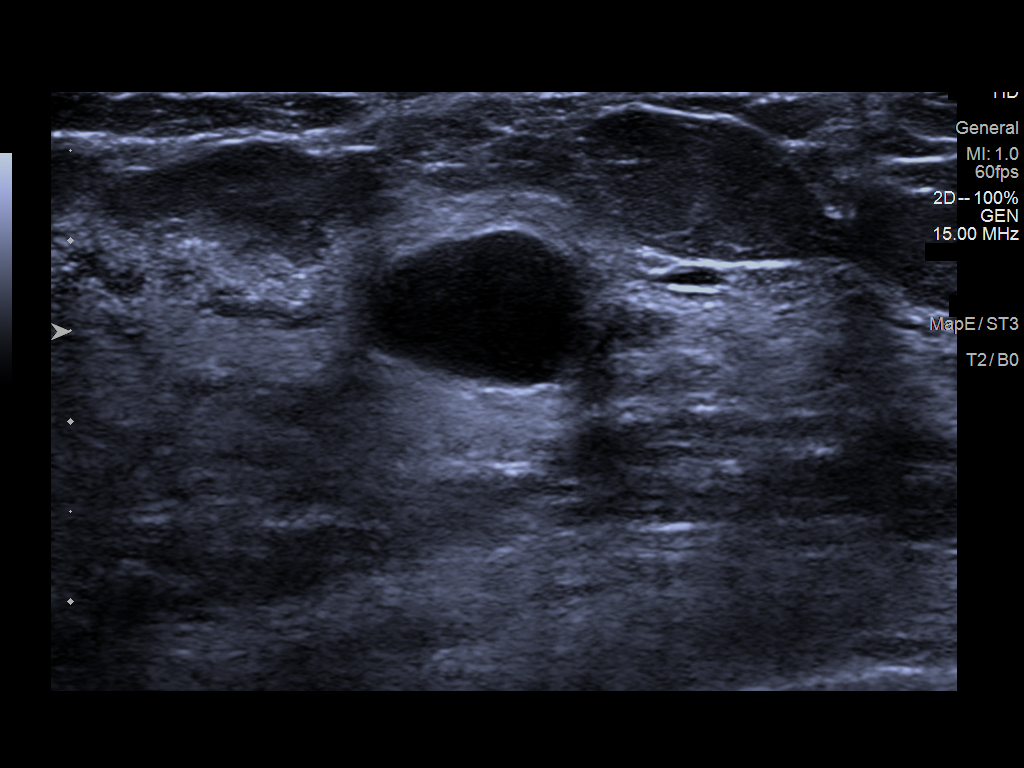
[im 5/5]
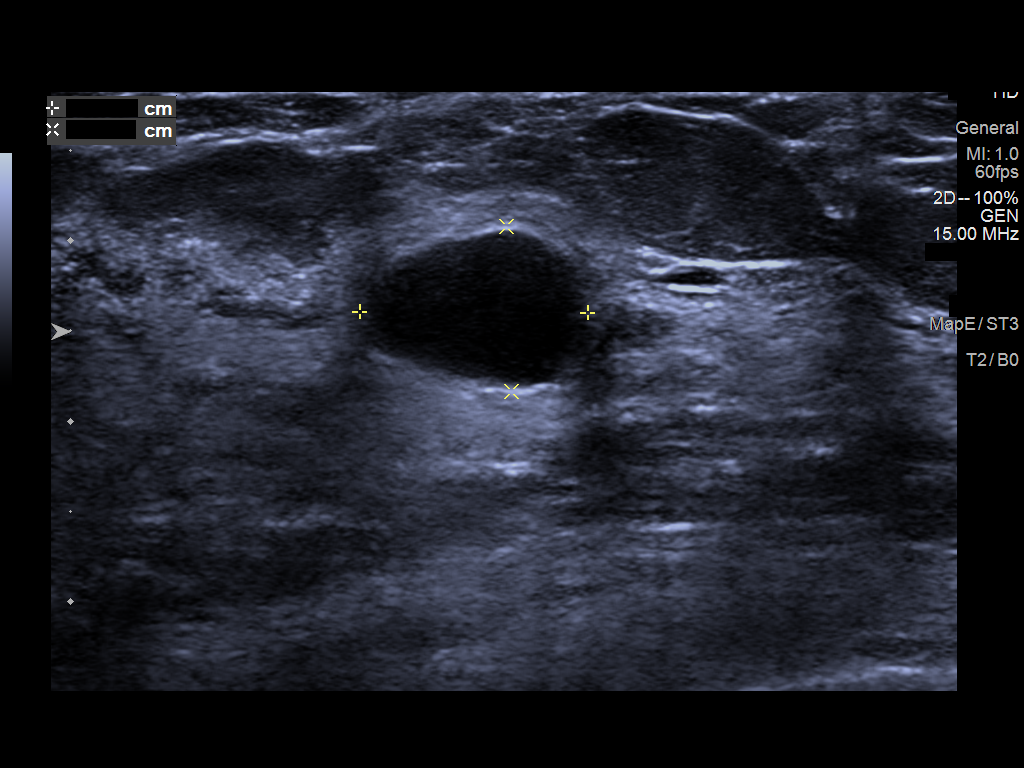

[5 of 5 positions shown; findings below may reference images not displayed]

FINDINGS: On physical exam, no suspicious mass is appreciated.

Targeted ultrasound was performed of the retroareolar breast. At 6
o'clock 1 cm from the nipple, there is an oval circumscribed
anechoic mass with posterior acoustic enhancement. It measures 14 by
13 x 9 mm and is consistent with a benign simple cyst. This
corresponds to the site of screening mammographic concern.
IMPRESSION: There is a benign cyst at the site of screening mammographic
concern.

RECOMMENDATION:
Screening mammogram in one year.(Code:AT-C-8B4)

I have discussed the findings and recommendations with the patient.
If applicable, a reminder letter will be sent to the patient
regarding the next appointment.

BI-RADS CATEGORY  2: Benign.

## 2023-05-27 ENCOUNTER — Other Ambulatory Visit: Payer: Self-pay | Admitting: Internal Medicine

## 2023-06-21 ENCOUNTER — Other Ambulatory Visit: Payer: Self-pay | Admitting: Internal Medicine

## 2023-06-30 ENCOUNTER — Encounter: Payer: Self-pay | Admitting: Internal Medicine

## 2023-06-30 ENCOUNTER — Ambulatory Visit (INDEPENDENT_AMBULATORY_CARE_PROVIDER_SITE_OTHER): Payer: No Typology Code available for payment source | Admitting: Internal Medicine

## 2023-06-30 ENCOUNTER — Telehealth: Payer: No Typology Code available for payment source | Admitting: Internal Medicine

## 2023-06-30 VITALS — BP 128/90 | HR 85 | Ht 63.0 in | Wt 174.6 lb

## 2023-06-30 DIAGNOSIS — E785 Hyperlipidemia, unspecified: Secondary | ICD-10-CM

## 2023-06-30 DIAGNOSIS — R7303 Prediabetes: Secondary | ICD-10-CM | POA: Diagnosis not present

## 2023-06-30 DIAGNOSIS — E6609 Other obesity due to excess calories: Secondary | ICD-10-CM

## 2023-06-30 DIAGNOSIS — E66811 Obesity, class 1: Secondary | ICD-10-CM

## 2023-06-30 DIAGNOSIS — Z6832 Body mass index (BMI) 32.0-32.9, adult: Secondary | ICD-10-CM

## 2023-06-30 DIAGNOSIS — I10 Essential (primary) hypertension: Secondary | ICD-10-CM | POA: Diagnosis not present

## 2023-06-30 DIAGNOSIS — Z0000 Encounter for general adult medical examination without abnormal findings: Secondary | ICD-10-CM | POA: Diagnosis not present

## 2023-06-30 DIAGNOSIS — Z630 Problems in relationship with spouse or partner: Secondary | ICD-10-CM | POA: Diagnosis not present

## 2023-06-30 DIAGNOSIS — R221 Localized swelling, mass and lump, neck: Secondary | ICD-10-CM

## 2023-06-30 MED ORDER — AMLODIPINE BESYLATE 5 MG PO TABS: 5.0000 mg | ORAL_TABLET | Freq: Every day | ORAL | 1 refills | Status: DC

## 2023-06-30 MED ORDER — ATORVASTATIN CALCIUM 20 MG PO TABS: 20.0000 mg | ORAL_TABLET | Freq: Every day | ORAL | 1 refills | Status: DC

## 2023-06-30 MED ORDER — HYDROCHLOROTHIAZIDE 25 MG PO TABS: 25.0000 mg | ORAL_TABLET | Freq: Every day | ORAL | 1 refills | Status: DC
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
## 2023-06-30 NOTE — Assessment & Plan Note (Signed)
Resolved on exam today.

## 2023-06-30 NOTE — Assessment & Plan Note (Signed)
Diastolic elevations noted at home as well. .  Checking urine for protein.  Note prior adverse reaction to lisinopril; will  consider use of ARB cautiously if positive

## 2023-06-30 NOTE — Assessment & Plan Note (Signed)
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
## 2023-07-01 LAB — CBC WITH DIFFERENTIAL/PLATELET
Basophils Absolute: 0.1 10*3/uL (ref 0.0–0.1)
Basophils Relative: 0.8 % (ref 0.0–3.0)
Eosinophils Absolute: 0.1 10*3/uL (ref 0.0–0.7)
Eosinophils Relative: 1.5 % (ref 0.0–5.0)
HCT: 42.6 % (ref 36.0–46.0)
Hemoglobin: 13.8 g/dL (ref 12.0–15.0)
Lymphocytes Relative: 48 % — ABNORMAL HIGH (ref 12.0–46.0)
Lymphs Abs: 3 10*3/uL (ref 0.7–4.0)
MCHC: 32.4 g/dL (ref 30.0–36.0)
MCV: 89 fL (ref 78.0–100.0)
Monocytes Absolute: 0.4 10*3/uL (ref 0.1–1.0)
Monocytes Relative: 6.2 % (ref 3.0–12.0)
Neutro Abs: 2.7 10*3/uL (ref 1.4–7.7)
Neutrophils Relative %: 43.5 % (ref 43.0–77.0)
Platelets: 311 10*3/uL (ref 150.0–400.0)
RBC: 4.78 Mil/uL (ref 3.87–5.11)
RDW: 13.5 % (ref 11.5–15.5)
WBC: 6.2 10*3/uL (ref 4.0–10.5)

## 2023-07-01 LAB — MICROALBUMIN / CREATININE URINE RATIO
Creatinine,U: 468.5 mg/dL
Microalb Creat Ratio: 0.3 mg/g (ref 0.0–30.0)
Microalb, Ur: 1.4 mg/dL (ref 0.0–1.9)

## 2023-07-01 LAB — LIPID PANEL
Cholesterol: 213 mg/dL — ABNORMAL HIGH (ref 0–200)
HDL: 55.1 mg/dL (ref >39.00)
LDL Cholesterol: 135 mg/dL — ABNORMAL HIGH (ref 0–99)
NonHDL: 157.5
Total CHOL/HDL Ratio: 4
Triglycerides: 112 mg/dL (ref 0.0–149.0)
VLDL: 22.4 mg/dL (ref 0.0–40.0)

## 2023-07-01 LAB — COMPREHENSIVE METABOLIC PANEL
ALT: 12 U/L (ref 0–35)
AST: 17 U/L (ref 0–37)
Albumin: 4.3 g/dL (ref 3.5–5.2)
Alkaline Phosphatase: 77 U/L (ref 39–117)
BUN: 15 mg/dL (ref 6–23)
CO2: 32 meq/L (ref 19–32)
Calcium: 9.3 mg/dL (ref 8.4–10.5)
Chloride: 101 meq/L (ref 96–112)
Creatinine, Ser: 1.09 mg/dL (ref 0.40–1.20)
GFR: 57.19 mL/min — ABNORMAL LOW (ref >60.00)
Glucose, Bld: 84 mg/dL (ref 70–99)
Potassium: 3.5 meq/L (ref 3.5–5.1)
Sodium: 141 meq/L (ref 135–145)
Total Bilirubin: 0.3 mg/dL (ref 0.2–1.2)
Total Protein: 7.6 g/dL (ref 6.0–8.3)

## 2023-07-01 LAB — LDL CHOLESTEROL, DIRECT: Direct LDL: 129 mg/dL

## 2023-07-01 LAB — HEMOGLOBIN A1C: Hgb A1c MFr Bld: 6.1 % (ref 4.6–6.5)

## 2023-07-01 LAB — TSH: TSH: 0.39 u[IU]/mL (ref 0.35–5.50)

## 2023-07-17 ENCOUNTER — Ambulatory Visit
Admission: RE | Admit: 2023-07-17 | Discharge: 2023-07-17 | Disposition: A | Discharge status: Home / Self Care | Payer: No Typology Code available for payment source | Source: Ambulatory Visit | Attending: Family Medicine | Admitting: Family Medicine

## 2023-07-17 ENCOUNTER — Ambulatory Visit (INDEPENDENT_AMBULATORY_CARE_PROVIDER_SITE_OTHER): Payer: No Typology Code available for payment source

## 2023-07-17 VITALS — BP 118/80 | HR 91 | Temp 98.4°F | Resp 14 | Ht 64.0 in | Wt 174.6 lb

## 2023-07-17 DIAGNOSIS — J069 Acute upper respiratory infection, unspecified: Secondary | ICD-10-CM

## 2023-07-17 MED ORDER — HYDROCOD POLI-CHLORPHE POLI ER 10-8 MG/5ML PO SUER: 5.0000 mL | Freq: Two times a day (BID) | ORAL | 0 refills | Status: DC | PRN

## 2023-07-17 MED ORDER — BENZONATATE 100 MG PO CAPS: 100.0000 mg | ORAL_CAPSULE | Freq: Three times a day (TID) | ORAL | 0 refills | Status: DC

## 2023-07-17 NOTE — Discharge Instructions (Addendum)
Your chest xray did not show evidence of bronchitis or pneumonia though the radiologist has not yet read it. If they find something that I didn't, I will call you.    I suspect you have a viral respiatory infection that will gradually resolve over the next.  Some cough medications to your pharmacy.  If your cough does not improve or gets worse after 2 weeks, return to the urgent care or see your primary care doctor.  If it suddenly gets worse and you have shortness of breath, go to the emergency department/ER.

## 2023-07-17 NOTE — ED Triage Notes (Signed)
Patient c/o cough, chest congestion, sinus drainage, and runny nose that started Saturday night. Patient denies fevers.

## 2023-07-17 NOTE — ED Provider Notes (Signed)
[facility]    CSN: [id number] Arrival date & time: 07/17/23  0835      History   Chief Complaint Chief Complaint  Patient presents with   Cough    Appointment    HPI Bethany Fernandez is a 57 y.o. female.   HPI  History obtained from the patient. Bethany Fernandez presents for productive cough, chest congestion, rhinorrhea and sinus pressure. Had a sore throat that started Sunday morning but has since resolved. She used a nasal lavage system that initially helped. This morning, she thought she was going to cough up a lung.  Took some Mucinex.  Her husband is well.  No known sick contacts but they did go out to eat on Thursday. No history of asthma and denies history of smoking.       Past Medical History:  Diagnosis Date   Chicken pox    Fibrocystic breast 2013   Hyperlipidemia    Hypertension 1999   Migraines     Patient Active Problem List   Diagnosis Date Noted   Marital conflict 06/30/2023   Lump in neck 05/16/2023   Sore throat 05/16/2023   Viral URI with cough 04/07/2023   Biceps strain, right, initial encounter 04/07/2023   Prediabetes 05/05/2021   Cervical cancer screening 10/01/2016   Long-term use of high-risk medication 09/26/2015   History of hysterectomy, supracervical 09/02/2014   Obesity 03/05/2014   Internal hemorrhoids 04/19/2013   Encounter for preventive health examination 04/19/2013   History of fibrocystic disease of breast 02/23/2013   Essential hypertension, benign 01/14/2013    Past Surgical History:  Procedure Laterality Date   ABDOMINAL HYSTERECTOMY     BREAST BIOPSY  2013   right   COLONOSCOPY WITH PROPOFOL N/A 02/03/2018   Procedure: COLONOSCOPY WITH PROPOFOL;  Surgeon: Pasty Spillers, MD;  Location: [facility] ENDOSCOPY;  Service: Endoscopy;  Laterality: N/A;   ECTOPIC PREGNANCY SURGERY     PARTIAL HYSTERECTOMY  2012    OB History     Gravida  2   Para      Term      Preterm      AB  2   Living         SAB       IAB      Ectopic  2   Multiple      Live Births           Obstetric Comments  First menstrual period age 6 Hysterectomy 2012          Home Medications    Prior to Admission medications   Medication Sig Start Date End Date Taking? Authorizing Provider  amLODipine (NORVASC) 5 MG tablet Take 1 tablet (5 mg total) by mouth daily. 06/30/23  Yes Sherlene Shams, MD  atorvastatin (LIPITOR) 20 MG tablet Take 1 tablet (20 mg total) by mouth daily. 06/30/23  Yes Sherlene Shams, MD  benzonatate (TESSALON) 100 MG capsule Take 1 capsule (100 mg total) by mouth every 8 (eight) hours. 07/17/23  Yes Nefi Musich, DO  calcium-vitamin D (OSCAL WITH D) 250-125 MG-UNIT per tablet Take 1 tablet by mouth daily.   Yes [provider]  chlorpheniramine-HYDROcodone (TUSSIONEX) 10-8 MG/5ML Take 5 mLs by mouth every 12 (twelve) hours as needed. 07/17/23  Yes Bocephus Cali, DO  hydrochlorothiazide (HYDRODIURIL) 25 MG tablet Take 1 tablet (25 mg total) by mouth daily. 06/30/23  Yes Sherlene Shams, MD  Multiple Vitamin (MULTI VITAMIN DAILY PO) Take by  mouth.   Yes [provider]  TURMERIC PO Take 1 tablet by mouth daily.   Yes [provider]    Family History Family History  Problem Relation Age of Onset   Hyperlipidemia Mother    Hypertension Mother    Hyperlipidemia Father    Hypertension Father    Diabetes Father    Heart disease Maternal Aunt 32       massive AMI, died   Kidney disease Maternal Uncle    Lung disease Maternal Grandfather 22   Alcohol abuse Maternal Grandfather    Stroke Paternal Grandfather    Breast cancer Cousin 50       mat cousin    Social History Social History   Tobacco Use   Smoking status: Never   Smokeless tobacco: Never  Vaping Use   Vaping status: Never Used  Substance Use Topics   Alcohol use: Yes    Alcohol/week: 0.0 standard drinks of alcohol    Comment: occ   Drug use: No     Allergies   Tape   Review of  Systems Review of Systems: negative unless otherwise stated in HPI.      Physical Exam Triage Vital Signs ED Triage Vitals  Encounter Vitals Group     BP 07/17/23 0857 118/80     Systolic BP Percentile --      Diastolic BP Percentile --      Pulse Rate 07/17/23 0857 91     Resp 07/17/23 0857 14     Temp 07/17/23 0857 98.4 F (36.9 C)     Temp Source 07/17/23 0857 Oral     SpO2 07/17/23 0857 96 %     Weight 07/17/23 0856 174 lb 9.7 oz (79.2 kg)     Height 07/17/23 0856 5\' 4"  (1.626 m)     Head Circumference --      Peak Flow --      Pain Score 07/17/23 0856 0     Pain Loc --      Pain Education --      Exclude from Growth Chart --    No data found.  Updated Vital Signs BP 118/80 (BP Location: Left Arm)   Pulse 91   Temp 98.4 F (36.9 C) (Oral)   Resp 14   Ht 5\' 4"  (1.626 m)   Wt 79.2 kg   SpO2 96%   BMI 29.97 kg/m   Visual Acuity Right Eye Distance:   Left Eye Distance:   Bilateral Distance:    Right Eye Near:   Left Eye Near:    Bilateral Near:     Physical Exam GEN:     alert, non-toxic appearing female in no distress    HENT:  mucus membranes moist, oropharyngeal without lesions or erythema, no tonsillar hypertrophy or exudates,  moderate erythematous edematous turbinates, clear nasal discharge EYES:   pupils equal and reactive, no scleral injection or discharge NECK:  normal ROM,no meningismus   RESP:  no increased work of breathing, coarse breathe sounds bilaterally, no wheezing  CVS:   regular rate and rhythm Skin:   warm and dry    UC Treatments / Results  Labs (all labs ordered are listed, but only abnormal results are displayed) Labs Reviewed - No data to display  EKG   Radiology DG Chest 2 View  Result Date: 07/17/2023 CLINICAL DATA:  Productive cough starting 6 days ago. EXAM: CHEST - 2 VIEW COMPARISON:  None Available. FINDINGS: Cardiac silhouette and mediastinal contours  are within normal limits. Mild calcification within the aortic  arch. The lungs are clear. No pleural effusion or pneumothorax. Mild-to-moderate multilevel degenerative disc changes of the thoracic spine. IMPRESSION: No active cardiopulmonary disease. Electronically Signed   By: Neita Garnet M.D.   On: 07/17/2023 11:02    Procedures Procedures (including critical care time)  Medications Ordered in UC Medications - No data to display  Initial Impression / Assessment and Plan / UC Course  I have reviewed the triage vital signs and the nursing notes.  Pertinent labs & imaging results that were available during my care of the patient were reviewed by me and considered in my medical decision making (see chart for details).       Pt is a 57 y.o. female who presents for 4 days of respiratory symptoms. Eulene is afebrile here without recent antipyretics. Satting well on room air. Overall pt is ill but non-toxic appearing, well hydrated, without respiratory distress. Pulmonary exam is remarkable for bilateral coarse breath sounds.  Chest xray personally reviewed by me without focal pneumonia, pleural effusion, cardiomegaly or pneumothorax. Patient aware the radiologist has not read her xray and is comfortable with the preliminary read by me. Will review radiologist read when available and call patient if a change in plan is warranted.  Pt agreeable to this plan prior to discharge.    History consistent with viral respiratory illness. Discussed symptomatic treatment.  Explained lack of efficacy of antibiotics in viral disease.  Typical duration of symptoms discussed.  Test Tessalon Perles prescribed for cough.  Return and ED precautions given and voiced understanding. Discussed MDM, treatment plan and plan for follow-up with patient who agrees with plan.   Radiologist impression reviewed  Final Clinical Impressions(s) / UC Diagnoses   Final diagnoses:  Viral URI with cough     Discharge Instructions      Your chest xray did not show evidence of  bronchitis or pneumonia though the radiologist has not yet read it. If they find something that I didn't, I will call you.    I suspect you have a viral respiatory infection that will gradually resolve over the next.  Some cough medications to your pharmacy.  If your cough does not improve or gets worse after 2 weeks, return to the urgent care or see your primary care doctor.  If it suddenly gets worse and you have shortness of breath, go to the emergency department/ER.      ED Prescriptions     Medication Sig Dispense Auth. Provider   benzonatate (TESSALON) 100 MG capsule Take 1 capsule (100 mg total) by mouth every 8 (eight) hours. 21 capsule Izmael Duross, DO   chlorpheniramine-HYDROcodone (TUSSIONEX) 10-8 MG/5ML Take 5 mLs by mouth every 12 (twelve) hours as needed. 115 mL Willia Genrich, Seward Meth, DO      I have reviewed the PDMP during this encounter.   Katha Cabal, DO 07/17/23 1126

## 2023-08-28 ENCOUNTER — Encounter: Payer: Self-pay | Admitting: Nurse Practitioner

## 2023-08-28 ENCOUNTER — Telehealth: Payer: No Typology Code available for payment source | Admitting: Nurse Practitioner

## 2023-08-28 VITALS — BP 125/89 | Temp 98.7°F | Ht 64.0 in | Wt 174.0 lb

## 2023-08-28 DIAGNOSIS — J069 Acute upper respiratory infection, unspecified: Secondary | ICD-10-CM | POA: Diagnosis not present

## 2023-08-28 MED ORDER — AZITHROMYCIN 250 MG PO TABS: ORAL_TABLET | ORAL | 0 refills | Status: AC

## 2023-08-28 NOTE — Progress Notes (Signed)
Virtual Visit via Video Note  I connected with Bethany Fernandez on 08/28/23 at 10:33 AM by a video enabled telemedicine application and verified that I am speaking with the correct person using two identifiers.  Patient Location: Home Provider Location: Office/Clinic  I discussed the limitations, risks, security, and privacy concerns of performing an evaluation and management service by video and the availability of in person appointments. I also discussed with the patient that there may be a patient responsible charge related to this service. The patient expressed understanding and agreed to proceed.  Subjective: PCP: Sherlene Shams, MD  Chief Complaint  Patient presents with   Sore Throat    Started Monday, sneezing, cough   HPI  Patient is seen due to congestion, cough and sorethroat. States that the sore throat has been subsided. Has lot of PND and congestion.   Has been using lavage system.   Denise fever, SOB, HA, Body aches  Medication used: Mucinex  ROS: Per HPI  Current Outpatient Medications:    amLODipine (NORVASC) 5 MG tablet, Take 1 tablet (5 mg total) by mouth daily., Disp: 90 tablet, Rfl: 1   atorvastatin (LIPITOR) 20 MG tablet, Take 1 tablet (20 mg total) by mouth daily., Disp: 90 tablet, Rfl: 1   azithromycin (ZITHROMAX) 250 MG tablet, Take 2 tablets on day 1, then 1 tablet daily on days 2 through 5, Disp: 6 tablet, Rfl: 0   benzonatate (TESSALON) 100 MG capsule, Take 1 capsule (100 mg total) by mouth every 8 (eight) hours., Disp: 21 capsule, Rfl: 0   calcium-vitamin D (OSCAL WITH D) 250-125 MG-UNIT per tablet, Take 1 tablet by mouth daily., Disp: , Rfl:    hydrochlorothiazide (HYDRODIURIL) 25 MG tablet, Take 1 tablet (25 mg total) by mouth daily., Disp: 90 tablet, Rfl: 1   Multiple Vitamin (MULTI VITAMIN DAILY PO), Take by mouth., Disp: , Rfl:    TURMERIC PO, Take 1 tablet by mouth daily., Disp: , Rfl:    chlorpheniramine-HYDROcodone (TUSSIONEX) 10-8 MG/5ML,  Take 5 mLs by mouth every 12 (twelve) hours as needed. (Patient not taking: Reported on 08/28/2023), Disp: 115 mL, Rfl: 0  Observations/Objective: Today's Vitals   08/28/23 0945  BP: 125/89  Temp: 98.7 F (37.1 C)  TempSrc: Temporal  Weight: 174 lb (78.9 kg)  Height: 5\' 4"  (1.626 m)   Physical Exam Constitutional:      General: She is not in acute distress.    Appearance: Normal appearance. She is not ill-appearing.  Eyes:     Conjunctiva/sclera: Conjunctivae normal.  Pulmonary:     Effort: No respiratory distress.  Neurological:     Mental Status: She is alert.  Psychiatric:        Mood and Affect: Mood normal.        Behavior: Behavior normal.        Thought Content: Thought content normal.        Judgment: Judgment normal.     Assessment and Plan: URI with cough and congestion Assessment & Plan: Pt is afebrile, non toxic appearing, without respiratoty distress.  Will treat with z-pack. Advised to continue take plain Mucinex and take OTC antihistamine for PND. Increase fluid intake and rest.   Other orders -     Azithromycin; Take 2 tablets on day 1, then 1 tablet daily on days 2 through 5  Dispense: 6 tablet; Refill: 0    Follow Up Instructions: Return if symptoms worsen or fail to improve.   I discussed the  assessment and treatment plan with the patient. The patient was provided an opportunity to ask questions, and all were answered. The patient agreed with the plan and demonstrated an understanding of the instructions.   The patient was advised to call back or seek an in-person evaluation if the symptoms worsen or if the condition fails to improve as anticipated.  The above assessment and management plan was discussed with the patient. The patient verbalized understanding of and has agreed to the management plan.   Kara Dies, NP

## 2023-08-28 NOTE — Assessment & Plan Note (Signed)
Pt is afebrile, non toxic appearing, without respiratoty distress.  Will treat with z-pack. Advised to continue take plain Mucinex and take OTC antihistamine for PND. Increase fluid intake and rest.

## 2023-08-28 NOTE — Patient Instructions (Signed)
Will treat with Zpack. Continue take plain Mucinex and take OTC antihistamine Incresae fluid intake and rest.

## 2023-09-01 ENCOUNTER — Telehealth: Payer: No Typology Code available for payment source | Admitting: Internal Medicine

## 2023-09-04 ENCOUNTER — Telehealth: Payer: No Typology Code available for payment source | Admitting: Internal Medicine

## 2023-10-24 ENCOUNTER — Other Ambulatory Visit: Payer: Self-pay | Admitting: Internal Medicine

## 2023-11-20 ENCOUNTER — Telehealth: Payer: Self-pay

## 2023-11-20 NOTE — Telephone Encounter (Signed)
 Copied from CRM [id number]. Topic: Clinical - Request for Lab/Test Order >> Nov 20, 2023 11:54 AM Drema Balzarine wrote: Reason for CRM: Patient insurance for job is requesting her to have anotrher set of labs done since her last labs numbers were high. Please call patient to confirm which labs she needs/schedule lab visit (no orders in EPIC)

## 2023-12-07 ENCOUNTER — Encounter: Payer: Self-pay | Admitting: Internal Medicine

## 2023-12-07 DIAGNOSIS — R7303 Prediabetes: Secondary | ICD-10-CM

## 2023-12-16 ENCOUNTER — Other Ambulatory Visit (INDEPENDENT_AMBULATORY_CARE_PROVIDER_SITE_OTHER)

## 2023-12-16 DIAGNOSIS — R7303 Prediabetes: Secondary | ICD-10-CM | POA: Diagnosis not present

## 2023-12-16 LAB — COMPREHENSIVE METABOLIC PANEL WITH GFR
ALT: 17 U/L (ref 0–35)
AST: 20 U/L (ref 0–37)
Albumin: 4.3 g/dL (ref 3.5–5.2)
Alkaline Phosphatase: 85 U/L (ref 39–117)
BUN: 10 mg/dL (ref 6–23)
CO2: 37 meq/L — ABNORMAL HIGH (ref 19–32)
Calcium: 9.2 mg/dL (ref 8.4–10.5)
Chloride: 96 meq/L (ref 96–112)
Creatinine, Ser: 1.05 mg/dL (ref 0.40–1.20)
GFR: 59.62 mL/min — ABNORMAL LOW (ref >60.00)
Glucose, Bld: 101 mg/dL — ABNORMAL HIGH (ref 70–99)
Potassium: 3.2 meq/L — ABNORMAL LOW (ref 3.5–5.1)
Sodium: 140 meq/L (ref 135–145)
Total Bilirubin: 0.8 mg/dL (ref 0.2–1.2)
Total Protein: 7.5 g/dL (ref 6.0–8.3)

## 2023-12-16 LAB — HEMOGLOBIN A1C: Hgb A1c MFr Bld: 6.2 % (ref 4.6–6.5)

## 2023-12-17 LAB — LIPID PANEL W/REFLEX DIRECT LDL
Cholesterol: 198 mg/dL (ref <200)
HDL: 66 mg/dL (ref >=50)
LDL Cholesterol (Calc): 115 mg/dL — ABNORMAL HIGH
Non-HDL Cholesterol (Calc): 132 mg/dL — ABNORMAL HIGH (ref <130)
Total CHOL/HDL Ratio: 3 (calc) (ref <5.0)
Triglycerides: 77 mg/dL (ref <150)

## 2023-12-20 ENCOUNTER — Encounter: Payer: Self-pay | Admitting: Internal Medicine

## 2023-12-21 ENCOUNTER — Encounter: Payer: Self-pay | Admitting: Internal Medicine

## 2023-12-21 ENCOUNTER — Telehealth: Admitting: Internal Medicine

## 2023-12-21 VITALS — Ht 64.0 in | Wt 174.0 lb

## 2023-12-21 DIAGNOSIS — R7303 Prediabetes: Secondary | ICD-10-CM

## 2023-12-21 DIAGNOSIS — I10 Essential (primary) hypertension: Secondary | ICD-10-CM | POA: Diagnosis not present

## 2023-12-21 DIAGNOSIS — Z1231 Encounter for screening mammogram for malignant neoplasm of breast: Secondary | ICD-10-CM

## 2023-12-21 MED ORDER — POTASSIUM CHLORIDE CRYS ER 20 MEQ PO TBCR: 20.0000 meq | EXTENDED_RELEASE_TABLET | Freq: Every day | ORAL | 0 refills | Status: DC

## 2023-12-21 MED ORDER — TELMISARTAN 40 MG PO TABS: 40.0000 mg | ORAL_TABLET | Freq: Every day | ORAL | 1 refills | Status: DC

## 2023-12-21 MED ORDER — ESCITALOPRAM OXALATE 5 MG PO TABS: 5.0000 mg | ORAL_TABLET | Freq: Every day | ORAL | 2 refills | Status: DC

## 2023-12-21 NOTE — Progress Notes (Unsigned)
 Virtual Visit via Caregility   Note   This format is felt to be most appropriate for this patient at this time.  All issues noted in this document were discussed and addressed.  No physical exam was performed (except for noted visual exam findings with Video Visits).   I connected withNAME@ on 12/21/23 at  5:00 PM EDT by a video enabled telemedicine application or telephone and verified that I am speaking with the correct person using two identifiers. Location patient: home Location provider: work or home office Persons participating in the virtual visit: patient, provider  I discussed the limitations, risks, security and privacy concerns of performing an evaluation and management service by telephone and the availability of in person appointments. I also discussed with the patient that there may be a patient responsible charge related to this service. The patient expressed understanding and agreed to proceed.   Reason for visit: ***  HPI:   HTN: taking amlodipine and hydrochlorothiazide . Cr now < 60 ml /min  and k low HCO3 high   Prediabetes   ROS: See pertinent positives and negatives per HPI.  Past Medical History:  Diagnosis Date   Chicken pox    Fibrocystic breast 2013   Hyperlipidemia    Hypertension 1999   Migraines     Past Surgical History:  Procedure Laterality Date   ABDOMINAL HYSTERECTOMY     BREAST BIOPSY  2013   right   COLONOSCOPY WITH PROPOFOL N/A 02/03/2018   Procedure: COLONOSCOPY WITH PROPOFOL;  Surgeon: Pasty Spillers, MD;  Location: [facility] END[facility]COPY;  Service: Endoscopy;  Laterality: N/A;   ECTOPIC PREGNANCY SURGERY     PARTIAL HYSTERECTOMY  2012    Family History  Problem Relation Age of Onset   Hyperlipidemia Mother    Hypertension Mother    Hyperlipidemia Father    Hypertension Father    Diabetes Father    Heart disease Maternal Aunt 59       massive AMI, died   Kidney disease Maternal Uncle    Lung disease Maternal Grandfather 24    Alcohol abuse Maternal Grandfather    Stroke Paternal Grandfather    Breast cancer Cousin 50       mat cousin    SOCIAL HX: ***   Current Outpatient Medications:    amLODipine (NORVASC) 5 MG tablet, TAKE 1 TABLET (5 MG TOTAL) BY MOUTH DAILY., Disp: 90 tablet, Rfl: 1   atorvastatin (LIPITOR) 20 MG tablet, TAKE 1 TABLET BY MOUTH EVERY DAY, Disp: 90 tablet, Rfl: 1   calcium-vitamin D (OSCAL WITH D) 250-125 MG-UNIT per tablet, Take 1 tablet by mouth daily., Disp: , Rfl:    hydrochlorothiazide (HYDRODIURIL) 25 MG tablet, TAKE 1 TABLET (25 MG TOTAL) BY MOUTH DAILY., Disp: 90 tablet, Rfl: 1   Multiple Vitamin (MULTI VITAMIN DAILY PO), Take by mouth., Disp: , Rfl:    TURMERIC PO, Take 1 tablet by mouth daily., Disp: , Rfl:   EXAM:  VITALS per patient if applicable:  GENERAL: alert, oriented, appears well and in no acute distress  HEENT: atraumatic, conjunttiva clear, no obvious abnormalities on inspection of external nose and ears  NECK: normal movements of the head and neck  LUNGS: on inspection no signs of respiratory distress, breathing rate appears normal, no obvious gross SOB, gasping or wheezing  CV: no obvious cyanosis  MS: moves all visible extremities without noticeable abnormality  PSYCH/NEURO: pleasant and cooperative, no obvious depression or anxiety, speech and thought processing grossly intact  ASSESSMENT  AND PLAN: Encounter for screening mammogram for malignant neoplasm of breast -     3D Screening Mammogram, Left and Right; Future      I discussed the assessment and treatment plan with the patient. The patient was provided an opportunity to ask questions and all were answered. The patient agreed with the plan and demonstrated an understanding of the instructions.   The patient was advised to call back or seek an in-person evaluation if the symptoms worsen or if the condition fails to improve as anticipated.   I spent 30 minutes dedicated to the care of this  patient on the date of this encounter to include pre-visit review of his medical history,  Face-to-face time with the patient , and post visit ordering of testing and therapeutics.    Sherlene Shams, MD

## 2023-12-21 NOTE — Patient Instructions (Signed)
 Stop the amlodipine and the hydrochlorothiazide.  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  Exercising your calf muscles and elevating your legs will help manage it    Trial of generic lexapro for the menopause symptoms .  Take it once daily at bedtime

## 2023-12-21 NOTE — Assessment & Plan Note (Signed)
 Hypokalemia , metabolic alkalosis ,  and persistent GFR < 60 ml/min noted on recent BMET.  All attributed to using hydrochlorothiazide to manage the fluid retention aggravated by use of amlodipine.  Stopping both and starting telmisartan .  Rtc 10 days for BMET

## 2023-12-22 NOTE — Assessment & Plan Note (Signed)
 Her  random glucose is not  elevated but her A1c continues to suggest she is at risk for developing diabetes.  I recommend he follow a low glycemic index diet and particpate regularly in an aerobic  exercise activity.  We will  check an A1c in 6 months.     Lab Results  Component Value Date   HGBA1C 6.2 12/16/2023

## 2023-12-22 NOTE — Assessment & Plan Note (Signed)
 counselling given.  She is exercising and reducing portion size.

## 2023-12-27 ENCOUNTER — Other Ambulatory Visit: Payer: Self-pay | Admitting: *Deleted

## 2023-12-27 DIAGNOSIS — I10 Essential (primary) hypertension: Secondary | ICD-10-CM

## 2023-12-28 ENCOUNTER — Other Ambulatory Visit

## 2024-01-04 ENCOUNTER — Other Ambulatory Visit (INDEPENDENT_AMBULATORY_CARE_PROVIDER_SITE_OTHER)

## 2024-01-04 ENCOUNTER — Other Ambulatory Visit

## 2024-01-04 DIAGNOSIS — I10 Essential (primary) hypertension: Secondary | ICD-10-CM

## 2024-01-04 LAB — BASIC METABOLIC PANEL WITH GFR
BUN: 10 mg/dL (ref 6–23)
CO2: 28 meq/L (ref 19–32)
Calcium: 8.9 mg/dL (ref 8.4–10.5)
Chloride: 104 meq/L (ref 96–112)
Creatinine, Ser: 0.97 mg/dL (ref 0.40–1.20)
GFR: 65.54 mL/min (ref >60.00)
Glucose, Bld: 102 mg/dL — ABNORMAL HIGH (ref 70–99)
Potassium: 3.8 meq/L (ref 3.5–5.1)
Sodium: 139 meq/L (ref 135–145)

## 2024-01-05 ENCOUNTER — Encounter: Payer: Self-pay | Admitting: Internal Medicine

## 2024-02-11 ENCOUNTER — Ambulatory Visit
Admission: RE | Admit: 2024-02-11 | Discharge: 2024-02-11 | Disposition: A | Discharge status: Home / Self Care | Source: Ambulatory Visit | Attending: Internal Medicine | Admitting: Internal Medicine

## 2024-02-11 DIAGNOSIS — Z1231 Encounter for screening mammogram for malignant neoplasm of breast: Secondary | ICD-10-CM

## 2024-03-09 ENCOUNTER — Ambulatory Visit
Admission: RE | Admit: 2024-03-09 | Discharge: 2024-03-09 | Disposition: A | Discharge status: Home / Self Care | Attending: Internal Medicine | Admitting: Internal Medicine

## 2024-03-09 ENCOUNTER — Ambulatory Visit
Admission: RE | Admit: 2024-03-09 | Discharge: 2024-03-09 | Disposition: A | Discharge status: Home / Self Care | Source: Ambulatory Visit | Attending: Internal Medicine | Admitting: Internal Medicine

## 2024-03-09 ENCOUNTER — Encounter: Payer: Self-pay | Admitting: Internal Medicine

## 2024-03-09 ENCOUNTER — Ambulatory Visit: Admitting: Internal Medicine

## 2024-03-09 VITALS — BP 138/74 | HR 85 | Ht 64.0 in | Wt 184.2 lb

## 2024-03-09 DIAGNOSIS — G8929 Other chronic pain: Secondary | ICD-10-CM | POA: Insufficient documentation

## 2024-03-09 DIAGNOSIS — M79671 Pain in right foot: Secondary | ICD-10-CM

## 2024-03-09 MED ORDER — MELOXICAM 15 MG PO TABS
15.0000 mg | ORAL_TABLET | Freq: Every day | ORAL | 0 refills | Status: AC
Start: 2024-03-09 — End: ?

## 2024-03-09 NOTE — Progress Notes (Signed)
 Subjective:  Patient ID: Bethany Fernandez, female    DOB: Oct 14, 1965  Age: 58 y.o. MRN: [id number]  CC: The encounter diagnosis was Chronic foot pain, right.   HPI Bethany Fernandez presents for  Chief Complaint  Patient presents with   Toe Pain    Middle toe pain radiating to top of right foot  Wn.    Right foot pain  that starts at the base of her 2nd toe and radiate to top of foot /  Present for 8 months, getting worse.  Started during a church cleanup , she accidentally kicked a heavy gauge plastic carpet cover  while wearing crocs.  No other unusual activities  but exercisign regularly with activities including running  in place, . Not jumping up and down.  Works from home,  does not wear  high heels . Has tried ibuprofen /tylenol.  .  Worse with weight bearing,  but also hurts to move toes   Outpatient Medications Prior to Visit  Medication Sig Dispense Refill   atorvastatin  (LIPITOR) 20 MG tablet TAKE 1 TABLET BY MOUTH EVERY DAY 90 tablet 1   calcium -vitamin D  (OSCAL WITH D) 250-125 MG-UNIT per tablet Take 1 tablet by mouth daily.     Multiple Vitamin (MULTI VITAMIN DAILY PO) Take by mouth.     telmisartan  (MICARDIS ) 40 MG tablet Take 1 tablet (40 mg total) by mouth at bedtime. 90 tablet 1   TURMERIC PO Take 1 tablet by mouth daily.     escitalopram  (LEXAPRO ) 5 MG tablet Take 1 tablet (5 mg total) by mouth at bedtime. (Patient not taking: Reported on 03/09/2024) 30 tablet 2   potassium chloride  SA (KLOR-CON  M) 20 MEQ tablet Take 1 tablet (20 mEq total) by mouth daily. 5 tablet 0   No facility-administered medications prior to visit.    Review of Systems;  Patient denies headache, fevers, malaise, unintentional weight loss, skin rash, eye pain, sinus congestion and sinus pain, sore throat, dysphagia,  hemoptysis , cough, dyspnea, wheezing, chest pain, palpitations, orthopnea, edema, abdominal pain, nausea, melena, diarrhea, constipation, flank pain, dysuria, hematuria, urinary   Frequency, nocturia, numbness, tingling, seizures,  Focal weakness, Loss of consciousness,  Tremor, insomnia, depression, anxiety, and suicidal ideation.      Objective:  BP 138/74   Pulse 85   Ht 5' 4 (1.626 m)   Wt 184 lb 3.2 oz (83.6 kg)   SpO2 96%   BMI 31.62 kg/m   BP Readings from Last 3 Encounters:  03/09/24 138/74  08/28/23 125/89  07/17/23 118/80    Wt Readings from Last 3 Encounters:  03/09/24 184 lb 3.2 oz (83.6 kg)  12/21/23 174 lb (78.9 kg)  08/28/23 174 lb (78.9 kg)    Physical Exam Vitals reviewed.  Constitutional:      General: She is not in acute distress.    Appearance: Normal appearance. She is normal weight. She is not ill-appearing, toxic-appearing or diaphoretic.  HENT:     Head: Normocephalic.   Eyes:     General: No scleral icterus.       Right eye: No discharge.        Left eye: No discharge.     Conjunctiva/sclera: Conjunctivae normal.    Cardiovascular:     Rate and Rhythm: Normal rate and regular rhythm.     Heart sounds: Normal heart sounds.  Pulmonary:     Effort: Pulmonary effort is normal. No respiratory distress.     Breath sounds: Normal breath  sounds.   Musculoskeletal:        General: Tenderness present. Normal range of motion.       Feet:  Feet:     Right foot:     Skin integrity: Skin integrity normal.     Left foot:     Skin integrity: Skin integrity normal.     Comments: Foot is tender to squeezing of toes.  Bsed on 2nd toe is tender to palpation   Skin:    General: Skin is warm and dry.   Neurological:     General: No focal deficit present.     Mental Status: She is alert and oriented to person, place, and time. Mental status is at baseline.   Psychiatric:        Mood and Affect: Mood normal.        Behavior: Behavior normal.        Thought Content: Thought content normal.        Judgment: Judgment normal.    Lab Results  Component Value Date   HGBA1C 6.2 12/16/2023   HGBA1C 6.1 06/30/2023   HGBA1C  6.1 05/12/2022    Lab Results  Component Value Date   CREATININE 0.97 01/04/2024   CREATININE 1.05 12/16/2023   CREATININE 1.09 06/30/2023    Lab Results  Component Value Date   WBC 6.2 06/30/2023   HGB 13.8 06/30/2023   HCT 42.6 06/30/2023   PLT 311.0 06/30/2023   GLUCOSE 102 (H) 01/04/2024   CHOL 198 12/16/2023   TRIG 77 12/16/2023   HDL 66 12/16/2023   LDLDIRECT 129.0 06/30/2023   LDLCALC 115 (H) 12/16/2023   ALT 17 12/16/2023   AST 20 12/16/2023   NA 139 01/04/2024   K 3.8 01/04/2024   CL 104 01/04/2024   CREATININE 0.97 01/04/2024   BUN 10 01/04/2024   CO2 28 01/04/2024   TSH 0.39 06/30/2023   HGBA1C 6.2 12/16/2023   MICROALBUR 1.4 06/30/2023    MM 3D SCREENING MAMMOGRAM BILATERAL BREAST Result Date: 02/16/2024 CLINICAL DATA:  Screening. EXAM: DIGITAL SCREENING BILATERAL MAMMOGRAM WITH TOMOSYNTHESIS AND CAD TECHNIQUE: Bilateral screening digital craniocaudal and mediolateral oblique mammograms were obtained. Bilateral screening digital breast tomosynthesis was performed. The images were evaluated with computer-aided detection. COMPARISON:  Previous exam(s). ACR Breast Density Category c: The breasts are heterogeneously dense, which may obscure small masses. FINDINGS: There are no findings suspicious for malignancy. IMPRESSION: No mammographic evidence of malignancy. A result letter of this screening mammogram will be mailed directly to the patient. RECOMMENDATION: Screening mammogram in one year. (Code:SM-B-01Y) BI-RADS CATEGORY  1: Negative. Electronically Signed   By: Alm Parkins M.D.   On: 02/16/2024 14:42    Assessment & Plan:  .Chronic foot pain, right Assessment & Plan: I suspect that when she jammed  her toe eight months ago when she kicked a stationery heavy object (heavy gauge plastic carpet cover for desk area) she may have developed a hairline fracture of the 2nd MT.  Sending to [facility] for films   Orders: -     DG Foot Complete Right; Future  Other  orders -     Meloxicam ; Take 1 tablet (15 mg total) by mouth daily.  Dispense: 30 tablet; Refill: 0     I spent 34 minutes on the day of this face to face encounter reviewing patient's  most recent visit with cardiology,  nephrology,  and neurology,  prior relevant surgical and non surgical procedures, recent  labs and imaging studies, counseling on  weight management,  reviewing the assessment and plan with patient, and post visit ordering and reviewing of  diagnostics and therapeutics with patient  .   Follow-up: No follow-ups on file.   Verneita LITTIE Kettering, MD

## 2024-03-09 NOTE — Patient Instructions (Signed)
 Please go to [facility] for your x rays of your right foot:  I am prescribing meloxicam to use once daily INSTEAD OF ibuprofen.   You can add up to 2000 mg of acetominophen (tylenol) every day safely  In divided doses ( 1000 mg every 12 hours.)

## 2024-03-09 NOTE — Assessment & Plan Note (Signed)
 I suspect that when she jammed  her toe eight months ago when she kicked a stationery heavy object (heavy gauge plastic carpet cover for desk area) she may have developed a hairline fracture of the 2nd MT.  Sending to [facility] for films

## 2024-03-15 ENCOUNTER — Ambulatory Visit: Payer: Self-pay | Admitting: Internal Medicine

## 2024-04-06 ENCOUNTER — Ambulatory Visit: Admitting: Internal Medicine

## 2024-07-01 ENCOUNTER — Ambulatory Visit: Admitting: Internal Medicine

## 2024-07-01 VITALS — BP 129/82 | HR 85 | Ht 64.0 in | Wt 185.8 lb

## 2024-07-01 DIAGNOSIS — I10 Essential (primary) hypertension: Secondary | ICD-10-CM

## 2024-07-01 DIAGNOSIS — R7303 Prediabetes: Secondary | ICD-10-CM | POA: Diagnosis not present

## 2024-07-01 DIAGNOSIS — E785 Hyperlipidemia, unspecified: Secondary | ICD-10-CM

## 2024-07-01 DIAGNOSIS — E6609 Other obesity due to excess calories: Secondary | ICD-10-CM

## 2024-07-01 DIAGNOSIS — Z23 Encounter for immunization: Secondary | ICD-10-CM

## 2024-07-01 DIAGNOSIS — Z0000 Encounter for general adult medical examination without abnormal findings: Secondary | ICD-10-CM

## 2024-07-01 DIAGNOSIS — E66811 Obesity, class 1: Secondary | ICD-10-CM

## 2024-07-01 DIAGNOSIS — Z6832 Body mass index (BMI) 32.0-32.9, adult: Secondary | ICD-10-CM

## 2024-07-01 MED ORDER — TELMISARTAN 40 MG PO TABS: 40.0000 mg | ORAL_TABLET | Freq: Every day | ORAL | 1 refills | Status: AC

## 2024-07-01 MED ORDER — ATORVASTATIN CALCIUM 20 MG PO TABS: 20.0000 mg | ORAL_TABLET | Freq: Every day | ORAL | 1 refills | Status: DC

## 2024-07-01 NOTE — Patient Instructions (Signed)
 You received your tetanus-diptheria-pertussis vaccine (TDaP)  vaccine  today which is good for ten years  Consider getting the Shingles 2 shot series (shingrix)   BP needs to be < 130/80 to be considered at goal.   Please check your blood pressure a few times at home and send me the readings so I can determine if you need to increase your TELMISARTAN  .

## 2024-07-01 NOTE — Assessment & Plan Note (Addendum)
 She developed hypokalemia , metabolic alkalosis ,  and persistent GFR < 60 ml/min o  hydrochlorothiazide  . Which was started to managed the fluid retention aggravated by use of amlodipine . All abnormalities resolved with stopping hydrochlorothiazide  and amlodipine  was changed to telmisartan .   However she has resumed hydrochlorothiazide  on her her own for management of edema  which is asymmetric ,  R > L due to prior ankle sprain .  Repeat labs today home readings are significantly lower than office readings.

## 2024-07-01 NOTE — Assessment & Plan Note (Signed)

## 2024-07-01 NOTE — Progress Notes (Unsigned)
 Patient ID: Bethany Fernandez, female    DOB: 1965/11/09  Age: 58 y.o. MRN: [id number]  The patient is here for annual preventive examination and management of other chronic and acute problems.   The risk factors are reflected in the social history.   The roster of all physicians providing medical care to patient - is listed in the Snapshot section of the chart.   Activities of daily living:  The patient is 100% independent in all ADLs: dressing, toileting, feeding as well as independent mobility   Home safety : The patient has smoke detectors in the home. They wear seatbelts.  There are no unsecured firearms at home. There is no violence in the home.    There is no risks for hepatitis, STDs or HIV. There is no   history of blood transfusion. They have no travel history to infectious disease endemic areas of the world.   The patient has seen their dentist in the last six month. They have seen their eye doctor in the last year. The patinet  denies slight hearing difficulty with regard to whispered voices and some television programs.  They have deferred audiologic testing in the last year.  They do not  have excessive sun exposure. Discussed the need for sun protection: hats, long sleeves and use of sunscreen if there is significant sun exposure.    Diet: the importance of a healthy diet is discussed. They do have a healthy diet.   The benefits of regular aerobic exercise were discussed. The patient  exercises  3 to 5 days per week  for  60 minutes.    Depression screen: there are no signs or vegative symptoms of depression- irritability, change in appetite, anhedonia, sadness/tearfullness.   The following portions of the patient's history were reviewed and updated as appropriate: allergies, current medications, past family history, past medical history,  past surgical history, past social history  and problem list.   Visual acuity was not assessed per patient preference since the patient has regular  follow up with an  ophthalmologist. Hearing and body mass index were assessed and reviewed.    During the course of the visit the patient was educated and counseled about appropriate screening and preventive services including : fall prevention , diabetes screening, nutrition counseling, colorectal cancer screening, and recommended immunizations.    Chief Complaint:   1) Edema of LE:  resumed hydrochlorothiazide  on her own  2) HTN:  taking telmiartan and hydrochlorothiazide .  Home readings  have been 129/82    Review of Symptoms  Patient denies headache, fevers, malaise, unintentional weight loss, skin rash, eye pain, sinus congestion and sinus pain, sore throat, dysphagia,  hemoptysis , cough, dyspnea, wheezing, chest pain, palpitations, orthopnea, edema, abdominal pain, nausea, melena, diarrhea, constipation, flank pain, dysuria, hematuria, urinary  Frequency, nocturia, numbness, tingling, seizures,  Focal weakness, Loss of consciousness,  Tremor, insomnia, depression, anxiety, and suicidal ideation.    Physical Exam:  BP 129/82 (Cuff Size: Normal)   Pulse 85   Ht 5' 4 (1.626 m)   Wt 185 lb 12.8 oz (84.3 kg)   SpO2 96%   BMI 31.89 kg/m    Physical Exam Vitals reviewed.  Constitutional:      General: She is not in acute distress.    Appearance: Normal appearance. She is well-developed and normal weight. She is not ill-appearing, toxic-appearing or diaphoretic.  HENT:     Head: Normocephalic.     Right Ear: Tympanic membrane, ear canal and external ear  normal. There is no impacted cerumen.     Left Ear: Tympanic membrane, ear canal and external ear normal. There is no impacted cerumen.     Nose: Nose normal.     Mouth/Throat:     Mouth: Mucous membranes are moist.     Pharynx: Oropharynx is clear.  Eyes:     General: No scleral icterus.       Right eye: No discharge.        Left eye: No discharge.     Conjunctiva/sclera: Conjunctivae normal.     Pupils: Pupils are equal,  round, and reactive to light.  Neck:     Thyroid : No thyromegaly.     Vascular: No carotid bruit or JVD.  Cardiovascular:     Rate and Rhythm: Normal rate and regular rhythm.     Heart sounds: Normal heart sounds.  Pulmonary:     Effort: Pulmonary effort is normal. No respiratory distress.     Breath sounds: Normal breath sounds.  Chest:  Breasts:    Breasts are symmetrical.     Right: Normal. No swelling, inverted nipple, mass, nipple discharge, skin change or tenderness.     Left: Normal. No swelling, inverted nipple, mass, nipple discharge, skin change or tenderness.  Abdominal:     General: Bowel sounds are normal.     Palpations: Abdomen is soft. There is no mass.     Tenderness: There is no abdominal tenderness. There is no guarding or rebound.  Musculoskeletal:        General: Normal range of motion.     Cervical back: Normal range of motion and neck supple.  Lymphadenopathy:     Cervical: No cervical adenopathy.     Upper Body:     Right upper body: No supraclavicular, axillary or pectoral adenopathy.     Left upper body: No supraclavicular, axillary or pectoral adenopathy.  Skin:    General: Skin is warm and dry.  Neurological:     General: No focal deficit present.     Mental Status: She is alert and oriented to person, place, and time. Mental status is at baseline.  Psychiatric:        Mood and Affect: Mood normal.        Behavior: Behavior normal.        Thought Content: Thought content normal.        Judgment: Judgment normal.     Assessment and Plan: Encounter for preventive health examination Assessment & Plan: age appropriate education and counseling updated, referrals for preventative services and immunizations addressed, dietary and smoking counseling addressed, most recent labs reviewed.  I have personally reviewed and have noted:   1) the patient's medical and social history 2) The pt's use of alcohol, tobacco, and illicit drugs 3) The patient's  current medications and supplements 4) Functional ability including ADL's, fall risk, home safety risk, hearing and visual impairment 5) Diet and physical activities 6) Evidence for depression or mood disorder 7) The patient's height, weight, and BMI have been recorded in the chart     I have made referrals, and provided counseling and education based on review of the above    Dyslipidemia -     Lipid panel -     LDL cholesterol, direct  Essential hypertension, benign Assessment & Plan: She developed hypokalemia , metabolic alkalosis ,  and persistent GFR < 60 ml/min o  hydrochlorothiazide  . Which was started to managed the fluid retention aggravated by use of amlodipine . All  abnormalities resolved with stopping hydrochlorothiazide  and amlodipine  was changed to telmisartan .   However she has resumed hydrochlorothiazide  on her her own for management of edema  which is asymmetric ,  R > L due to prior ankle sprain .  Repeat labs today home readings are significantly lower than office readings.   Orders: -     Comprehensive metabolic panel with GFR -     Microalbumin / creatinine urine ratio  Prediabetes -     Comprehensive metabolic panel with GFR -     Hemoglobin A1c  Class 1 obesity due to excess calories with body mass index (BMI) of 32.0 to 32.9 in adult, unspecified whether serious comorbidity present -     TSH -     CBC with Differential/Platelet  Other orders -     Atorvastatin  Calcium ; Take 1 tablet (20 mg total) by mouth daily.  Dispense: 90 tablet; Refill: 1 -     Telmisartan ; Take 1 tablet (40 mg total) by mouth at bedtime.  Dispense: 90 tablet; Refill: 1    No follow-ups on file.  Verneita LITTIE Kettering, MD

## 2024-07-02 LAB — MICROALBUMIN / CREATININE URINE RATIO
Creatinine, Urine: 59.2 mg/dL
Microalb/Creat Ratio: <5 mg/g{creat} (ref 0–29)
Microalbumin, Urine: <3 ug/mL

## 2024-07-03 ENCOUNTER — Ambulatory Visit: Payer: Self-pay | Admitting: Internal Medicine

## 2024-07-04 LAB — CBC WITH DIFFERENTIAL/PLATELET
Basophils Absolute: 0 x10E3/uL (ref 0.0–0.2)
Basos: 1 %
EOS (ABSOLUTE): 0.1 x10E3/uL (ref 0.0–0.4)
Eos: 1 %
Hematocrit: 41.1 % (ref 34.0–46.6)
Hemoglobin: 13.5 g/dL (ref 11.1–15.9)
Immature Grans (Abs): 0 x10E3/uL (ref 0.0–0.1)
Immature Granulocytes: 0 %
Lymphocytes Absolute: 3 x10E3/uL (ref 0.7–3.1)
Lymphs: 44 %
MCH: 29.4 pg (ref 26.6–33.0)
MCHC: 32.8 g/dL (ref 31.5–35.7)
MCV: 90 fL (ref 79–97)
Monocytes Absolute: 0.6 x10E3/uL (ref 0.1–0.9)
Monocytes: 8 %
Neutrophils Absolute: 3.2 x10E3/uL (ref 1.4–7.0)
Neutrophils: 46 %
Platelets: 295 x10E3/uL (ref 150–450)
RBC: 4.59 x10E6/uL (ref 3.77–5.28)
RDW: 13.3 % (ref 11.7–15.4)
WBC: 6.9 x10E3/uL (ref 3.4–10.8)

## 2024-07-04 LAB — COMPREHENSIVE METABOLIC PANEL WITH GFR
ALT: 23 IU/L (ref 0–32)
AST: 22 IU/L (ref 0–40)
Albumin: 4.5 g/dL (ref 3.8–4.9)
Alkaline Phosphatase: 124 IU/L (ref 49–135)
BUN/Creatinine Ratio: 11 (ref 9–23)
BUN: 10 mg/dL (ref 6–24)
Bilirubin Total: 0.3 mg/dL (ref 0.0–1.2)
CO2: 28 mmol/L (ref 20–29)
Calcium: 9.6 mg/dL (ref 8.7–10.2)
Chloride: 96 mmol/L (ref 96–106)
Creatinine, Ser: 0.92 mg/dL (ref 0.57–1.00)
Globulin, Total: 2.8 g/dL (ref 1.5–4.5)
Glucose: 72 mg/dL (ref 70–99)
Potassium: 3.8 mmol/L (ref 3.5–5.2)
Sodium: 139 mmol/L (ref 134–144)
Total Protein: 7.3 g/dL (ref 6.0–8.5)
eGFR: 73 mL/min/1.73 (ref >59)

## 2024-07-04 LAB — LIPID PANEL
Chol/HDL Ratio: 3.5 ratio (ref 0.0–4.4)
Cholesterol, Total: 208 mg/dL — ABNORMAL HIGH (ref 100–199)
HDL: 60 mg/dL (ref >39)
LDL Chol Calc (NIH): 118 mg/dL — ABNORMAL HIGH (ref 0–99)
Triglycerides: 171 mg/dL — ABNORMAL HIGH (ref 0–149)
VLDL Cholesterol Cal: 30 mg/dL (ref 5–40)

## 2024-07-04 LAB — HEMOGLOBIN A1C
Est. average glucose Bld gHb Est-mCnc: 123 mg/dL
Hgb A1c MFr Bld: 5.9 % — AB (ref 4.8–5.6)

## 2024-07-04 LAB — LDL CHOLESTEROL, DIRECT: LDL Direct: 120 mg/dL — ABNORMAL HIGH (ref 0–99)

## 2024-07-04 LAB — TSH: TSH: 0.634 u[IU]/mL (ref 0.450–4.500)

## 2024-10-04 ENCOUNTER — Ambulatory Visit (INDEPENDENT_AMBULATORY_CARE_PROVIDER_SITE_OTHER): Admitting: Internal Medicine

## 2024-10-04 ENCOUNTER — Encounter: Payer: Self-pay | Admitting: Internal Medicine

## 2024-10-04 VITALS — BP 170/96 | HR 83 | Ht 64.0 in | Wt 183.6 lb

## 2024-10-04 DIAGNOSIS — I160 Hypertensive urgency: Secondary | ICD-10-CM | POA: Diagnosis not present

## 2024-10-04 DIAGNOSIS — I10 Essential (primary) hypertension: Secondary | ICD-10-CM | POA: Diagnosis not present

## 2024-10-04 MED ORDER — ATORVASTATIN CALCIUM 20 MG PO TABS: 20.0000 mg | ORAL_TABLET | Freq: Every day | ORAL | 1 refills | Status: AC

## 2024-10-04 NOTE — Patient Instructions (Addendum)
 Increase the telmisartan  to 80 mg daily if after 3 DAYS BP IS NOT LES THAN 160,  ADD THE hydrochlorothiazide    RETURN WITH A 24 HOUR COLLECTION OF URINE  TO RULE OUT  THE ZEBRA DIAGNOSIS  of pheochromocytoma

## 2024-10-04 NOTE — Progress Notes (Unsigned)
 "  Subjective:  Patient ID: Bethany Fernandez, female    DOB: 12/05/1965  Age: 59 y.o. MRN: [id number]  CC: There were no encounter diagnoses.   HPI Bethany Fernandez presents for  Chief Complaint  Patient presents with   Hospitalization Follow-up    ED follow up for elevated blood pressure   59 YR OLD FEMALE WITH history of hypertension evaluated at [facility] ER on Jan 18  for elevated BP of 172/87. Labs EKG done.  Sent home  Home readings have been 168/106,  165/111    174/116 and 161/107  Has  not been using NSAIDs  not using diet pills.  Does not salt food but eats sausage,  pot pies,     Outpatient Medications Prior to Visit  Medication Sig Dispense Refill   atorvastatin  (LIPITOR) 20 MG tablet Take 1 tablet (20 mg total) by mouth daily. 90 tablet 1   Multiple Vitamin (MULTI VITAMIN DAILY PO) Take by mouth.     telmisartan  (MICARDIS ) 40 MG tablet Take 1 tablet (40 mg total) by mouth at bedtime. 90 tablet 1   TURMERIC PO Take 1 tablet by mouth daily.     calcium -vitamin D  (OSCAL WITH D) 250-125 MG-UNIT per tablet Take 1 tablet by mouth daily.     No facility-administered medications prior to visit.    Review of Systems;  Patient denies headache, fevers, malaise, unintentional weight loss, skin rash, eye pain, sinus congestion and sinus pain, sore throat, dysphagia,  hemoptysis , cough, dyspnea, wheezing, chest pain, palpitations, orthopnea, edema, abdominal pain, nausea, melena, diarrhea, constipation, flank pain, dysuria, hematuria, urinary  Frequency, nocturia, numbness, tingling, seizures,  Focal weakness, Loss of consciousness,  Tremor, insomnia, depression, anxiety, and suicidal ideation.      Objective:  BP (!) 170/96   Pulse 83   Ht 5' 4 (1.626 m)   Wt 183 lb 9.6 oz (83.3 kg)   SpO2 97%   BMI 31.51 kg/m   BP Readings from Last 3 Encounters:  10/04/24 (!) 170/96  07/01/24 129/82  03/09/24 138/74    Wt Readings from Last 3 Encounters:  10/04/24 183 lb 9.6 oz (83.3 kg)   07/01/24 185 lb 12.8 oz (84.3 kg)  03/09/24 184 lb 3.2 oz (83.6 kg)    Physical Exam  Lab Results  Component Value Date   HGBA1C 5.9 (H) 07/01/2024   HGBA1C 6.2 12/16/2023   HGBA1C 6.1 06/30/2023    Lab Results  Component Value Date   CREATININE 0.92 07/01/2024   CREATININE 0.97 01/04/2024   CREATININE 1.05 12/16/2023    Lab Results  Component Value Date   WBC 6.9 07/01/2024   HGB 13.5 07/01/2024   HCT 41.1 07/01/2024   PLT 295 07/01/2024   GLUCOSE 72 07/01/2024   CHOL 208 (H) 07/01/2024   TRIG 171 (H) 07/01/2024   HDL 60 07/01/2024   LDLDIRECT 120 (H) 07/01/2024   LDLCALC 118 (H) 07/01/2024   ALT 23 07/01/2024   AST 22 07/01/2024   NA 139 07/01/2024   K 3.8 07/01/2024   CL 96 07/01/2024   CREATININE 0.92 07/01/2024   BUN 10 07/01/2024   CO2 28 07/01/2024   TSH 0.634 07/01/2024   HGBA1C 5.9 (H) 07/01/2024    DG Foot Complete Right Result Date: 03/14/2024 CLINICAL DATA:  8 months history of distal foot pain at base of second toe, radiates to midfoot. Accidentally kicked a chair older 8 months ago. EXAM: RIGHT FOOT COMPLETE - 3+ VIEW COMPARISON:  Right  tibia and fibula radiographs 07/31/2017 FINDINGS: There is moderate lobular calcification measuring up to approximately 8 x 4 mm at the medial aspect of the second metatarsophalangeal joint. Joint spaces are maintained. Tiny plantar calcaneal heel spur. No acute fracture dislocation. IMPRESSION: Moderate lobular calcification at the medial aspect of the second metatarsophalangeal joint. This is nonspecific but may represent hydroxyapatite deposition disease, calcium  pyrophosphate deposition disease, self-limiting acute calcific periarthritis, among other etiologies. Electronically Signed   By: Tanda Lyons M.D.   On: 03/14/2024 13:19    Assessment & Plan:  .There are no diagnoses linked to this encounter.   I spent 34 minutes on the day of this face to face encounter reviewing patient's  most recent visit with  cardiology,  nephrology,  and neurology,  prior relevant surgical and non surgical procedures, recent  labs and imaging studies, counseling on weight management,  reviewing the assessment and plan with patient, and post visit ordering and reviewing of  diagnostics and therapeutics with patient  .   Follow-up: No follow-ups on file.   Verneita LITTIE Kettering, MD "

## 2024-10-05 NOTE — Assessment & Plan Note (Addendum)
 Patient 's hypertension is uncontrolled.  She is s/p  post discharge from  [facility] ER visit on   Jan 18 and has no new issues   I have reviewed the records from the hospital admission in detail with patient today. And completed a medication reconciliation.   Advised to increase telmisartan  to 80 mg and add hydrochlorothiazide  in a few days if not at goal.

## 2024-10-06 ENCOUNTER — Other Ambulatory Visit

## 2024-10-06 DIAGNOSIS — I160 Hypertensive urgency: Secondary | ICD-10-CM

## 2024-10-08 LAB — CATECHOLAMINES, FRACTIONATED, URINE, 24 HOUR
Dopamine , 24H Ur: 360 ug/(24.h) (ref 0–510)
Dopamine, Rand Ur: 288 ug/L
Epinephrine, 24H Ur: 8 ug/(24.h) (ref 0–20)
Epinephrine, Rand Ur: 6 ug/L
Norepinephrine, 24H Ur: 76 ug/(24.h) (ref 0–135)
Norepinephrine, Rand Ur: 61 ug/L

## 2024-10-10 ENCOUNTER — Ambulatory Visit: Payer: Self-pay | Admitting: Internal Medicine

## 2025-07-04 ENCOUNTER — Encounter: Admitting: Internal Medicine
